# Patient Record
Sex: Female | Born: 1990 | Race: Black or African American | Hispanic: No | Marital: Married | Smoking: Former smoker
Health system: Southern US, Community
[De-identification: ages and names within clinical notes are randomized; demographics above are authoritative.]

## PROBLEM LIST (undated history)

## (undated) DIAGNOSIS — M2550 Pain in unspecified joint: Secondary | ICD-10-CM

## (undated) DIAGNOSIS — Z789 Other specified health status: Secondary | ICD-10-CM

## (undated) HISTORY — PX: NO PAST SURGERIES: SHX2092

---
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
## 2012-12-23 ENCOUNTER — Other Ambulatory Visit: Payer: Self-pay | Admitting: Family Medicine

## 2012-12-23 DIAGNOSIS — N644 Mastodynia: Secondary | ICD-10-CM

## 2012-12-29 ENCOUNTER — Ambulatory Visit
Admission: RE | Admit: 2012-12-29 | Discharge: 2012-12-29 | Disposition: A | Discharge status: Home / Self Care | Payer: BC Managed Care – PPO | Source: Ambulatory Visit | Attending: Family Medicine | Admitting: Family Medicine

## 2012-12-29 DIAGNOSIS — N644 Mastodynia: Secondary | ICD-10-CM

## 2013-01-06 ENCOUNTER — Other Ambulatory Visit: Payer: Self-pay

## 2014-04-29 NOTE — L&D Delivery Note (Signed)
Delivery Note At 8:23 PM a viable female was delivered via  (Presentation:  Occiput posterior ;  ) Nuchal cord x 1 was reduced. .  APGAR:7 and 9 , ; weight  .   Placenta status: intact , . 3 vessel  Cord:  with the following complications:chorioamnionitis  .  Cord pH: NA Anesthesia: Epidural  Episiotomy: None Lacerations:  2nd degree laceration  Suture Repair: 3.0 vicryl Est. Blood Loss (mL):  300 mL  Mom to postpartum.  Baby to Couplet care / Skin to Skin.  Brianna Bennett J. 11/15/2014, 8:58 PM

## 2014-07-29 ENCOUNTER — Other Ambulatory Visit (HOSPITAL_COMMUNITY)
Admission: RE | Admit: 2014-07-29 | Discharge: 2014-07-29 | Disposition: A | Discharge status: Home / Self Care | Payer: Managed Care, Other (non HMO) | Source: Ambulatory Visit | Attending: Obstetrics & Gynecology | Admitting: Obstetrics & Gynecology

## 2014-07-29 ENCOUNTER — Other Ambulatory Visit: Payer: Self-pay | Admitting: Obstetrics & Gynecology

## 2014-07-29 DIAGNOSIS — Z113 Encounter for screening for infections with a predominantly sexual mode of transmission: Secondary | ICD-10-CM | POA: Diagnosis present

## 2014-07-29 DIAGNOSIS — Z01419 Encounter for gynecological examination (general) (routine) without abnormal findings: Secondary | ICD-10-CM | POA: Insufficient documentation

## 2014-08-02 LAB — CYTOLOGY - PAP

## 2014-08-19 LAB — OB RESULTS CONSOLE HIV ANTIBODY (ROUTINE TESTING): HIV: NONREACTIVE

## 2014-08-19 LAB — OB RESULTS CONSOLE RUBELLA ANTIBODY, IGM: RUBELLA: IMMUNE

## 2014-08-19 LAB — OB RESULTS CONSOLE HEPATITIS B SURFACE ANTIGEN: Hepatitis B Surface Ag: NEGATIVE

## 2014-08-19 LAB — OB RESULTS CONSOLE RPR: RPR: NONREACTIVE

## 2014-11-14 ENCOUNTER — Inpatient Hospital Stay (HOSPITAL_COMMUNITY): Payer: No Typology Code available for payment source

## 2014-11-14 ENCOUNTER — Encounter (HOSPITAL_COMMUNITY): Payer: Self-pay | Admitting: *Deleted

## 2014-11-14 ENCOUNTER — Inpatient Hospital Stay (HOSPITAL_COMMUNITY)
Admission: AD | Admit: 2014-11-14 | Discharge: 2014-11-14 | Disposition: A | Discharge status: Home / Self Care | Payer: No Typology Code available for payment source | Source: Ambulatory Visit | Attending: Obstetrics & Gynecology | Admitting: Obstetrics & Gynecology

## 2014-11-14 DIAGNOSIS — IMO0002 Reserved for concepts with insufficient information to code with codable children: Secondary | ICD-10-CM | POA: Insufficient documentation

## 2014-11-14 DIAGNOSIS — Z87891 Personal history of nicotine dependence: Secondary | ICD-10-CM | POA: Diagnosis not present

## 2014-11-14 DIAGNOSIS — Z3A39 39 weeks gestation of pregnancy: Secondary | ICD-10-CM | POA: Insufficient documentation

## 2014-11-14 DIAGNOSIS — O9989 Other specified diseases and conditions complicating pregnancy, childbirth and the puerperium: Secondary | ICD-10-CM | POA: Diagnosis present

## 2014-11-14 HISTORY — DX: Pain in unspecified joint: M25.50

## 2014-11-14 MED ORDER — LACTATED RINGERS IV SOLN
INTRAVENOUS | Status: DC
  Administered 2014-11-14: 21:00:00 via INTRAVENOUS

## 2014-11-14 NOTE — MAU Provider Note (Signed)
Kaylee Armstrong is a [age] y.o. G1P0 at 39.4 weeks came from the office bc of non reactive tracing.   VE in the office 3cm.  BPP in MAU 8/8.    History     There are no active problems to display for this patient.   No chief complaint on file.  HPI  OB History    Gravida Para Term Preterm AB TAB SAB Ectopic Multiple Living   1               Past Medical History  Diagnosis Date  . Joint pain     History reviewed. No pertinent past surgical history.  History reviewed. No pertinent family history.  History  Substance Use Topics  . Smoking status: Former Games developermoker  . Smokeless tobacco: Not on file  . Alcohol Use: Yes     Comment: NONE    DUTING  PREG    Allergies: No Known Allergies  Prescriptions prior to admission  Medication Sig Dispense Refill Last Dose  . acetaminophen (TYLENOL) 325 MG tablet Take 650 mg by mouth every 6 (six) hours as needed.   11/14/2014 at Unknown time  . Prenatal Vit-Fe Fumarate-FA (PRENATAL MULTIVITAMIN) TABS tablet Take 1 tablet by mouth daily at 12 noon.   11/14/2014 at Unknown time    ROS See HPI above, all other systems are negative  Physical Exam   Blood pressure 120/68, pulse 111, temperature 98.2 F (36.8 C), temperature source Oral, resp. rate 20, height 5\' 8"  (1.727 m), weight 262 lb (118.842 kg).  Physical Exam Ext:  WNL ABD: Soft, non tender to palpation, no rebound or guarding SVE -    ED Course  Assessment: IUP at  39.4weeks Membranes: intact FHR:  2006-2040 - Category 2 - 140, minimum variability, no accel, no decels CTX:  irregular BPP 8/8 AFI 13.4  Plan:  IV hydration   Venus Standard, CNM, MSN 11/14/2014. 8:41 PM     2045 Addendum SVE: unchanged from the office check - 3cm 2041 - 2140 - Category 1 - 140 moderate variability, + accel, no decel, occasional ctx  Finish IVF and recheck in 1 hour   2213 MAU Addendum Note  Category 1 VE unchanged     Plan: -Discussed need to follow up in office   -Bleeding and Labor Precautions -kick counts -Encouraged to call if any questions or concerns arise prior to next scheduled office visit.  -Discharged to home in stable condition    Venus Standard, CNM, MSN 11/14/2014. 10:13 PM

## 2014-11-14 NOTE — MAU Note (Signed)
PT  SAYS HE WENT  TO WORK  TODAY   WITH  [facility]-   THEN  TO OFFICE -   - UC Q 3 MIN-  AND  COULD  NOT  GET GOOD  FHR.    VE  3  CM .      DENIES  HSV AND MRSA.  GBS- POSITIVE.

## 2014-11-15 ENCOUNTER — Encounter (HOSPITAL_COMMUNITY): Payer: Self-pay

## 2014-11-15 ENCOUNTER — Inpatient Hospital Stay (HOSPITAL_COMMUNITY): Payer: No Typology Code available for payment source | Admitting: Anesthesiology

## 2014-11-15 ENCOUNTER — Inpatient Hospital Stay (HOSPITAL_COMMUNITY)
Admission: AD | Admit: 2014-11-15 | Discharge: 2014-11-17 | DRG: 775 | Disposition: A | Discharge status: Home / Self Care | Payer: No Typology Code available for payment source | Source: Ambulatory Visit | Attending: Obstetrics & Gynecology | Admitting: Obstetrics & Gynecology

## 2014-11-15 DIAGNOSIS — M358 Other specified systemic involvement of connective tissue: Secondary | ICD-10-CM | POA: Diagnosis present

## 2014-11-15 DIAGNOSIS — Z3A39 39 weeks gestation of pregnancy: Secondary | ICD-10-CM | POA: Diagnosis present

## 2014-11-15 DIAGNOSIS — O411230 Chorioamnionitis, third trimester, not applicable or unspecified: Secondary | ICD-10-CM | POA: Diagnosis present

## 2014-11-15 DIAGNOSIS — O0933 Supervision of pregnancy with insufficient antenatal care, third trimester: Secondary | ICD-10-CM

## 2014-11-15 DIAGNOSIS — N719 Inflammatory disease of uterus, unspecified: Secondary | ICD-10-CM | POA: Diagnosis present

## 2014-11-15 DIAGNOSIS — O99824 Streptococcus B carrier state complicating childbirth: Secondary | ICD-10-CM | POA: Diagnosis present

## 2014-11-15 DIAGNOSIS — Z87891 Personal history of nicotine dependence: Secondary | ICD-10-CM | POA: Diagnosis not present

## 2014-11-15 DIAGNOSIS — IMO0002 Reserved for concepts with insufficient information to code with codable children: Secondary | ICD-10-CM | POA: Insufficient documentation

## 2014-11-15 HISTORY — DX: Other specified health status: Z78.9

## 2014-11-15 LAB — CBC
HCT: 33.6 % — ABNORMAL LOW (ref 36.0–46.0)
Hemoglobin: 11.5 g/dL — ABNORMAL LOW (ref 12.0–15.0)
MCH: 30.7 pg (ref 26.0–34.0)
MCHC: 34.2 g/dL (ref 30.0–36.0)
MCV: 89.8 fL (ref 78.0–100.0)
Platelets: 242 10*3/uL (ref 150–400)
RBC: 3.74 MIL/uL — ABNORMAL LOW (ref 3.87–5.11)
RDW: 13.3 % (ref 11.5–15.5)
WBC: 10.3 10*3/uL (ref 4.0–10.5)

## 2014-11-15 LAB — COMPREHENSIVE METABOLIC PANEL
ALK PHOS: 161 U/L — AB (ref 38–126)
ALT: 25 U/L (ref 14–54)
ANION GAP: 4 — AB (ref 5–15)
AST: 40 U/L (ref 15–41)
Albumin: 3.1 g/dL — ABNORMAL LOW (ref 3.5–5.0)
BILIRUBIN TOTAL: 0.5 mg/dL (ref 0.3–1.2)
BUN: 7 mg/dL (ref 6–20)
CHLORIDE: 106 mmol/L (ref 101–111)
CO2: 23 mmol/L (ref 22–32)
Calcium: 8.6 mg/dL — ABNORMAL LOW (ref 8.9–10.3)
Creatinine, Ser: 0.65 mg/dL (ref 0.44–1.00)
GFR calc Af Amer: >60 mL/min (ref >60)
GLUCOSE: 88 mg/dL (ref 65–99)
POTASSIUM: 4.1 mmol/L (ref 3.5–5.1)
Sodium: 133 mmol/L — ABNORMAL LOW (ref 135–145)
Total Protein: 6.7 g/dL (ref 6.5–8.1)

## 2014-11-15 LAB — OB RESULTS CONSOLE GBS: GBS: POSITIVE

## 2014-11-15 LAB — TYPE AND SCREEN
ABO/RH(D): A POS
Antibody Screen: NEGATIVE

## 2014-11-15 LAB — PROTEIN / CREATININE RATIO, URINE
Creatinine, Urine: 81 mg/dL
Total Protein, Urine: <6 mg/dL

## 2014-11-15 LAB — ABO/RH: ABO/RH(D): A POS

## 2014-11-15 LAB — OB RESULTS CONSOLE GC/CHLAMYDIA
Chlamydia: NEGATIVE
GC PROBE AMP, GENITAL: NEGATIVE

## 2014-11-15 LAB — RPR: RPR: NONREACTIVE

## 2014-11-15 LAB — URIC ACID: URIC ACID, SERUM: 4 mg/dL (ref 2.3–6.6)

## 2014-11-15 MED ORDER — ONDANSETRON HCL 4 MG/2ML IJ SOLN
4.0000 mg | Freq: Four times a day (QID) | INTRAMUSCULAR | Status: DC | PRN
  Administered 2014-11-15: 4 mg via INTRAVENOUS
  Filled 2014-11-15: qty 2

## 2014-11-15 MED ORDER — TERBUTALINE SULFATE 1 MG/ML IJ SOLN
0.2500 mg | Freq: Once | INTRAMUSCULAR | Status: DC | PRN
  Filled 2014-11-15: qty 1

## 2014-11-15 MED ORDER — OXYTOCIN 40 UNITS IN LACTATED RINGERS INFUSION - SIMPLE MED
1.0000 m[IU]/min | INTRAVENOUS | Status: DC
  Administered 2014-11-15: 1 m[IU]/min via INTRAVENOUS

## 2014-11-15 MED ORDER — LIDOCAINE HCL (PF) 1 % IJ SOLN: 10.0000 mL | Freq: Once | INTRAMUSCULAR | Status: DC

## 2014-11-15 MED ORDER — BENZOCAINE-MENTHOL 20-0.5 % EX AERO
1.0000 | INHALATION_SPRAY | CUTANEOUS | Status: DC | PRN
Start: 2014-11-15 — End: 2014-11-17
  Administered 2014-11-16: 1 via TOPICAL
  Filled 2014-11-15 (×2): qty 56

## 2014-11-15 MED ORDER — FENTANYL 2.5 MCG/ML BUPIVACAINE 1/10 % EPIDURAL INFUSION (WH - ANES)
14.0000 mL/h | INTRAMUSCULAR | Status: DC | PRN
  Administered 2014-11-15 (×2): 14 mL/h via EPIDURAL
  Filled 2014-11-15 (×2): qty 125

## 2014-11-15 MED ORDER — LACTATED RINGERS IV SOLN
INTRAVENOUS | Status: DC
  Administered 2014-11-15: 07:00:00 via INTRAVENOUS

## 2014-11-15 MED ORDER — ACETAMINOPHEN 325 MG PO TABS
650.0000 mg | ORAL_TABLET | ORAL | Status: DC | PRN
  Administered 2014-11-15: 650 mg via ORAL
  Administered 2014-11-15: 325 mg via ORAL
  Filled 2014-11-15 (×2): qty 2

## 2014-11-15 MED ORDER — MISOPROSTOL 200 MCG PO TABS: 1000.0000 ug | ORAL_TABLET | Freq: Once | ORAL | Status: DC | PRN

## 2014-11-15 MED ORDER — OXYTOCIN 40 UNITS IN LACTATED RINGERS INFUSION - SIMPLE MED
62.5000 mL/h | INTRAVENOUS | Status: DC
  Filled 2014-11-15: qty 1000

## 2014-11-15 MED ORDER — PHENYLEPHRINE 40 MCG/ML (10ML) SYRINGE FOR IV PUSH (FOR BLOOD PRESSURE SUPPORT)
80.0000 ug | PREFILLED_SYRINGE | INTRAVENOUS | Status: DC | PRN
  Filled 2014-11-15: qty 2
  Filled 2014-11-15: qty 20

## 2014-11-15 MED ORDER — WITCH HAZEL-GLYCERIN EX PADS: 1.0000 "application " | MEDICATED_PAD | CUTANEOUS | Status: DC | PRN

## 2014-11-15 MED ORDER — CARBOPROST TROMETHAMINE 250 MCG/ML IM SOLN
INTRAMUSCULAR | Status: AC
  Filled 2014-11-15: qty 1

## 2014-11-15 MED ORDER — PRENATAL MULTIVITAMIN CH
1.0000 | ORAL_TABLET | Freq: Every day | ORAL | Status: DC
  Administered 2014-11-16 – 2014-11-17 (×2): 1 via ORAL
  Filled 2014-11-15 (×2): qty 1

## 2014-11-15 MED ORDER — DIBUCAINE 1 % RE OINT: 1.0000 "application " | TOPICAL_OINTMENT | RECTAL | Status: DC | PRN

## 2014-11-15 MED ORDER — ONDANSETRON HCL 4 MG/2ML IJ SOLN: 4.0000 mg | INTRAMUSCULAR | Status: DC | PRN

## 2014-11-15 MED ORDER — DIPHENHYDRAMINE HCL 25 MG PO CAPS: 25.0000 mg | ORAL_CAPSULE | Freq: Four times a day (QID) | ORAL | Status: DC | PRN

## 2014-11-15 MED ORDER — VITAMIN K1 1 MG/0.5ML IJ SOLN
INTRAMUSCULAR | Status: AC
  Filled 2014-11-15: qty 0.5

## 2014-11-15 MED ORDER — EPHEDRINE 5 MG/ML INJ
10.0000 mg | INTRAVENOUS | Status: DC | PRN
  Filled 2014-11-15: qty 2

## 2014-11-15 MED ORDER — OXYCODONE-ACETAMINOPHEN 5-325 MG PO TABS: 2.0000 | ORAL_TABLET | ORAL | Status: DC | PRN

## 2014-11-15 MED ORDER — SODIUM CHLORIDE 0.9 % IV SOLN
3.0000 g | Freq: Four times a day (QID) | INTRAVENOUS | Status: AC
  Administered 2014-11-15 – 2014-11-16 (×4): 3 g via INTRAVENOUS
  Filled 2014-11-15 (×5): qty 3

## 2014-11-15 MED ORDER — IBUPROFEN 600 MG PO TABS
600.0000 mg | ORAL_TABLET | Freq: Four times a day (QID) | ORAL | Status: DC
  Administered 2014-11-16 – 2014-11-17 (×8): 600 mg via ORAL
  Filled 2014-11-15 (×8): qty 1

## 2014-11-15 MED ORDER — ACETAMINOPHEN 325 MG PO TABS: 650.0000 mg | ORAL_TABLET | ORAL | Status: DC | PRN

## 2014-11-15 MED ORDER — CARBOPROST TROMETHAMINE 250 MCG/ML IM SOLN: 250.0000 ug | Freq: Once | INTRAMUSCULAR | Status: DC

## 2014-11-15 MED ORDER — SENNOSIDES-DOCUSATE SODIUM 8.6-50 MG PO TABS
2.0000 | ORAL_TABLET | ORAL | Status: DC
  Administered 2014-11-16 (×2): 2 via ORAL
  Filled 2014-11-15 (×2): qty 2

## 2014-11-15 MED ORDER — CITRIC ACID-SODIUM CITRATE 334-500 MG/5ML PO SOLN: 30.0000 mL | ORAL | Status: DC | PRN

## 2014-11-15 MED ORDER — OXYCODONE-ACETAMINOPHEN 5-325 MG PO TABS: 1.0000 | ORAL_TABLET | ORAL | Status: DC | PRN

## 2014-11-15 MED ORDER — BUTORPHANOL TARTRATE 1 MG/ML IJ SOLN
2.0000 mg | Freq: Once | INTRAMUSCULAR | Status: AC
  Administered 2014-11-15: 2 mg via INTRAVENOUS

## 2014-11-15 MED ORDER — FERROUS SULFATE 325 (65 FE) MG PO TABS
325.0000 mg | ORAL_TABLET | Freq: Two times a day (BID) | ORAL | Status: DC
  Administered 2014-11-16 – 2014-11-17 (×4): 325 mg via ORAL
  Filled 2014-11-15 (×4): qty 1

## 2014-11-15 MED ORDER — ZOLPIDEM TARTRATE 5 MG PO TABS: 5.0000 mg | ORAL_TABLET | Freq: Every evening | ORAL | Status: DC | PRN

## 2014-11-15 MED ORDER — BUTORPHANOL TARTRATE 1 MG/ML IJ SOLN
INTRAMUSCULAR | Status: AC
  Filled 2014-11-15: qty 1

## 2014-11-15 MED ORDER — FLEET ENEMA 7-19 GM/118ML RE ENEM: 1.0000 | ENEMA | RECTAL | Status: DC | PRN

## 2014-11-15 MED ORDER — OXYTOCIN 40 UNITS IN LACTATED RINGERS INFUSION - SIMPLE MED: 1.0000 m[IU]/min | INTRAVENOUS | Status: DC

## 2014-11-15 MED ORDER — LACTATED RINGERS IV SOLN: 500.0000 mL | INTRAVENOUS | Status: DC | PRN

## 2014-11-15 MED ORDER — DIPHENHYDRAMINE HCL 50 MG/ML IJ SOLN: 12.5000 mg | INTRAMUSCULAR | Status: DC | PRN

## 2014-11-15 MED ORDER — PENICILLIN G POTASSIUM 5000000 UNITS IJ SOLR
5.0000 10*6.[IU] | Freq: Once | INTRAVENOUS | Status: AC
  Administered 2014-11-15: 5 10*6.[IU] via INTRAVENOUS
  Filled 2014-11-15: qty 5

## 2014-11-15 MED ORDER — LANOLIN HYDROUS EX OINT: TOPICAL_OINTMENT | CUTANEOUS | Status: DC | PRN

## 2014-11-15 MED ORDER — OXYCODONE-ACETAMINOPHEN 5-325 MG PO TABS
1.0000 | ORAL_TABLET | ORAL | Status: DC | PRN
Start: 2014-11-15 — End: 2014-11-15

## 2014-11-15 MED ORDER — MISOPROSTOL 200 MCG PO TABS
ORAL_TABLET | ORAL | Status: AC
  Filled 2014-11-15: qty 5

## 2014-11-15 MED ORDER — BUTORPHANOL TARTRATE 1 MG/ML IJ SOLN
INTRAMUSCULAR | Status: AC
Start: 2014-11-15 — End: 2014-11-15
  Administered 2014-11-15: 2 mg via INTRAVENOUS
  Filled 2014-11-15: qty 1

## 2014-11-15 MED ORDER — LIDOCAINE HCL (PF) 1 % IJ SOLN
30.0000 mL | INTRAMUSCULAR | Status: AC | PRN
  Administered 2014-11-15: 4 mL via SUBCUTANEOUS
  Administered 2014-11-15: 2 mL via SUBCUTANEOUS
  Administered 2014-11-15: 4 mL via SUBCUTANEOUS

## 2014-11-15 MED ORDER — ONDANSETRON HCL 4 MG PO TABS: 4.0000 mg | ORAL_TABLET | ORAL | Status: DC | PRN

## 2014-11-15 MED ORDER — OXYTOCIN BOLUS FROM INFUSION
500.0000 mL | INTRAVENOUS | Status: DC
  Administered 2014-11-15: 500 mL via INTRAVENOUS

## 2014-11-15 MED ORDER — DEXTROSE 5 % IV SOLN
2.5000 10*6.[IU] | INTRAVENOUS | Status: DC
  Administered 2014-11-15 (×2): 2.5 10*6.[IU] via INTRAVENOUS
  Filled 2014-11-15 (×6): qty 2.5

## 2014-11-15 MED ORDER — SIMETHICONE 80 MG PO CHEW: 80.0000 mg | CHEWABLE_TABLET | ORAL | Status: DC | PRN

## 2014-11-15 NOTE — H&P (Signed)
HPI: 24 y/o G1P0 @ 2673w5d estimated gestational age (as dated by 24 week ultrasound) presents complaining of regular painful contractions.   no Leaking of Fluid,   no Vaginal Bleeding,   + Uterine Contractions,  + Fetal Movement.  ROS: no HA, no epigastric pain, no visual changes.    Pregnancy complicated by: 1) Connective tissue disease: +ANA, + antisclerodermAb; SSA/SSB neg, C3/C4 wnl, anti-dsDNA neg- d/w MFM recommend serial growth Last US @ 3544w4d: vertex/anterior/EFW: 7#4oz (65%) 2) Obesity: BMI 39 3) Late prenatal care- starting at 25wks    Prenatal Transfer Tool  Maternal Diabetes: No Genetic Screening: late to care Maternal Ultrasounds/Referrals: Normal Fetal Ultrasounds or other Referrals:  None Maternal Substance Abuse:  No Significant Maternal Medications:  None Significant Maternal Lab Results: Lab values include: Group B Strep positive   PNL:  GBS positive, Rub Immune, Hep B neg, RPR NR, HIV neg, GC/C neg, glucola:normal (80) Immunizations: Tdap given (09/08/14) Hgb: 11.6 Blood type: A positive  OBHx: primip PMHx:  Connective tissue disease (mostly joint pain, managed with OTC medication) Meds:  PNV, Tylenol prn Allergy:  No Known Allergies SurgHx: none SocHx:   Quit in 2013 Tobacco, no  EtOH, no Illicit Drugs  O: BP 139/80 mmHg  Pulse 91  Temp(Src) 98.8 F (37.1 C) (Axillary)  Resp 18  Ht 5\' 8"  (1.727 m)  Wt 118.842 kg (262 lb)  BMI 39.85 kg/m2  SpO2 100% Gen. AAOx3, NAD Abd. Gravid,  no tenderness Extr.  no edema B/L , no calf tenderness bilaterally FHT: 135 baseline, moderate variability, + accels,  no decels Toco: q 3-5 min SVE: 4/80/-2, per CNM Kaylee Armstrong   Labs: see orders  A/P:  24 y.o. G1P0 @ 8173w5d EGA who presents transitioning to active labor -FWB:  NICHD Cat I FHTs -Labor: expectant management -GBS: positive, PCN per protocol -Elevated BP, currently asymptomatic, will check preeclampsia labs  Myna HidalgoJennifer Shalice Woodring, DO [phone]  (pager) [phone] (office)

## 2014-11-15 NOTE — Anesthesia Preprocedure Evaluation (Signed)
Anesthesia Evaluation  Patient identified by MRN, date of birth, ID band Patient awake    Reviewed: Allergy & Precautions, H&P , NPO status , Patient's Chart, lab work & pertinent test results, reviewed documented beta blocker date and time   Airway Mallampati: II  TM Distance: >3 FB Neck ROM: full    Dental no notable dental hx.    Pulmonary former smoker,  breath sounds clear to auscultation  Pulmonary exam normal       Cardiovascular negative cardio ROS Normal cardiovascular examRhythm:regular Rate:Normal     Neuro/Psych negative neurological ROS  negative psych ROS   GI/Hepatic negative GI ROS, Neg liver ROS,   Endo/Other  negative endocrine ROS  Renal/GU negative Renal ROS  negative genitourinary   Musculoskeletal   Abdominal   Peds  Hematology negative hematology ROS (+)   Anesthesia Other Findings   Reproductive/Obstetrics (+) Pregnancy                             Anesthesia Physical Anesthesia Plan  ASA: II  Anesthesia Plan: Epidural   Post-op Pain Management:    Induction:   Airway Management Planned:   Additional Equipment:   Intra-op Plan:   Post-operative Plan:   Informed Consent: I have reviewed the patients History and Physical, chart, labs and discussed the procedure including the risks, benefits and alternatives for the proposed anesthesia with the patient or authorized representative who has indicated his/her understanding and acceptance.     Plan Discussed with:   Anesthesia Plan Comments:         Anesthesia Quick Evaluation

## 2014-11-15 NOTE — Lactation Note (Signed)
This note was copied from the chart of Kaylee Armstrong. Lactation Consultation Note  3 hours old. P1, Mother has large breasts with tough nipples that invert when compressed.   Reviewed hand expression, no drops expressed.  Encouraged mother to continue to try. Prepumped w/ hand pump. Fitted manual pump with #30 flange. Infant oral assessment indicates a high palate and disorganized suck at this time and tongue thrusting. Attempted latching in football hold.  Baby would mouth nipple but did not suck. Introduced #24NS.  Baby sucked a few times but did not sustain latch. Suggest mother place baby STS and retry when baby cues. Room full of family members w/ lots of questions about how we are going to feed baby. Described size of baby's stomach and baby will cue when hungry.   Described cluster feeding. Mom encouraged to feed baby 8-12 times/24 hours and with feeding cues.  Mom made aware of O/P services, breastfeeding support groups, community resources, and our phone # for post-discharge questions.  Suggest parents call if needed.  Discussed plan w/ Tenea RN.  Patient Name: Kaylee Armstrong ZOXWR'UToday's Date: 11/15/2014 Reason for consult: Initial assessment   Maternal Data Has patient been taught Hand Expression?: Yes Does the patient have breastfeeding experience prior to this delivery?: No  Feeding    LATCH Score/Interventions                      Lactation Tools Discussed/Used     Consult Status Consult Status: Follow-up Date: 11/16/14 Follow-up type: In-patient    Dahlia ByesBerkelhammer, Ruth [facility] 11/15/2014, 11:33 PM

## 2014-11-15 NOTE — Progress Notes (Signed)
OB PN:  S: Pt resting comfortably with epidural, no acute complaints  O: BP 111/74 mmHg  Pulse 166  Temp(Src) 98.2 F (36.8 C) (Axillary)  Resp 18  Ht 5\' 8"  (1.727 m)  Wt 118.842 kg (262 lb)  BMI 39.85 kg/m2  SpO2 95%  FHT: 130bpm, moderate variablity, + accels, no decels Toco: q3-795min (though sometimes difficult to trace)  SVE: C/C/+1  A/P: 24 y.o. G1P0 @ 3835w5d in active labor 1. FWB: Cat. I 2. Labor: continue with expectant management.  Plan for trial of pushing once further descent Pain: continue with epidural GBS: positive, continue PCN per protocol  Myna HidalgoJennifer Kenyette Gundy, DO [phone] (pager) [phone] (office)

## 2014-11-15 NOTE — Anesthesia Procedure Notes (Addendum)
Epidural Patient location during procedure: OB  Staffing Anesthesiologist: Dace Denn Performed by: anesthesiologist   Preanesthetic Checklist Completed: patient identified, site marked, surgical consent, pre-op evaluation, timeout performed, IV checked, risks and benefits discussed and monitors and equipment checked  Epidural Patient position: sitting Prep: site prepped and draped and DuraPrep Patient monitoring: continuous pulse ox and blood pressure Approach: midline Location: L2-L3 Injection technique: LOR air  Needle:  Needle type: Tuohy  Needle gauge: 17 G Needle length: 9 cm and 9 Needle insertion depth: 6 cm Catheter type: closed end flexible Catheter size: 19 Gauge Catheter at skin depth: 10 cm Test dose: negative  Assessment Events: blood not aspirated, injection not painful, no injection resistance, negative IV test and no paresthesia  Additional Notes Patient identified. Risks/Benefits/Options discussed with patient including but not limited to bleeding, infection, nerve damage, paralysis, failed block, incomplete pain control, headache, blood pressure changes, nausea, vomiting, reactions to medication both or allergic, itching and postpartum back pain. Confirmed with bedside nurse the patient's most recent platelet count. Confirmed with patient that they are not currently taking any anticoagulation, have any bleeding history or any family history of bleeding disorders. Patient expressed understanding and wished to proceed. All questions were answered. Sterile technique was used throughout the entire procedure. Please see nursing notes for vital signs. Test dose was given through epidural catheter and negative prior to continuing to dose epidural or start infusion. Warning signs of high block given to the patient including shortness of breath, tingling/numbness in hands, complete motor block, or any concerning symptoms with instructions to call for help. Patient was  given instructions on fall risk and not to get out of bed. All questions and concerns addressed with instructions to call with any issues or inadequate analgesia.

## 2014-11-15 NOTE — MAU Note (Signed)
Fetal monitor tracing from MAU is a paper tracing, not recorded in [facility].

## 2014-11-15 NOTE — Progress Notes (Signed)
OB PN:  S: Pt resting comfortably with epidurall, no acute complaints  O: BP 141/91 mmHg  Pulse 96  Temp(Src) 97.8 F (36.6 C) (Axillary)  Resp 18  Ht 5\' 8"  (1.727 m)  Wt 118.842 kg (262 lb)  BMI 39.85 kg/m2  SpO2 95%  FHT: 130bpm, moderate variablity, + accels, no decels Toco: q623min SVE: 9/C/-2  A/P: 24 y.o. G1P0 @ 281w5d in active labor 1. FWB: Cat. i 2. Labor: continue with expectant management Pain: continue with epidural GBS: positive, continue PCN per protocol  Myna HidalgoJennifer Mendell Bontempo, DO [phone] (pager) [phone] (office)

## 2014-11-15 NOTE — H&P (Signed)
Kaylee DanielDeja Raul Armstrong is a 24 y.o. female, G1 P0 at 39.5 weeks returned c/o more intense ctx.  Denies vb orlof w/+FM  Patient Active Problem List   Diagnosis Date Noted  . Indication for care in labor or delivery 11/15/2014    Pregnancy Course: EDC of 11/16/14 was established.      US evaluations:  No information in [place]       Significant prenatal events:     Last evaluation:     Reason for admission:  labor  Pt States:   Contractions Frequency: q3-4         Contraction severity: strong         Fetal activity: +FM  OB History    Gravida Para Term Preterm AB TAB SAB Ectopic Multiple Living   1              Past Medical History  Diagnosis Date  . Joint pain   . Medical history non-contributory    Past Surgical History  Procedure Laterality Date  . No past surgeries     Family History: family history is not on file. Social History:  reports that she has quit smoking. She does not have any smokeless tobacco history on file. She reports that she drinks alcohol. She reports that she does not use illicit drugs.   Prenatal Transfer Tool  Maternal Diabetes: No per pt Genetic Screening:  Maternal Ultrasounds/Referrals: no Fetal Ultrasounds or other Referrals:  no Maternal Substance Abuse:  no Significant Maternal Medications:  no Significant Maternal Lab Results:  No per pt   ROS:  See HPI above, all other systems are negative  No Known Allergies  Dilation: 4.5 Effacement (%): 80 Station: -2 Exam by:: Blaise Palladino, CNM  Blood pressure 141/79, pulse 98, temperature 99.1 F (37.3 C), temperature source Oral, resp. rate 21, height 5\' 8"  (1.727 m), weight 262 lb (118.842 kg), SpO2 100 %.  Maternal Exam:  Uterine Assessment: Contraction frequency is rare.  Abdomen: Gravid, non tender. Fundal height is aga.  Normal external genitalia, vulva, cervix, uterus and adnexa.  No lesions noted on exam.  Pelvis adequate for delivery.  Fetal presentation: Vertex by VE  Fetal Exam:   Monitor Surveillance : Continuous Monitoring  Mode: Ultrasound.  NICHD: Category 1, 140, moderate variability, + accel, no decel CTXs: Q 3-714minutes EFW   7 lbs  Physical Exam: Nursing note and vitals reviewed General: alert and cooperative She appears well nourished Psychiatric: Normal mood and affect. Her behavior is normal Head: Normocephalic Eyes: Pupils are equal, round, and reactive to light Neck: Normal range of motion Cardiovascular: RRR without murmur  Respiratory: CTAB. Effort normal  Abd: soft, non-tender, +BS, no rebound, no guarding  Genitourinary: Vagina normal  Neurological: A&Ox3 Skin: Warm and dry  Musculoskeletal: Normal range of motion  Homan's sign negative bilaterally No evidence of DVTs.  Edema: Minimal bilaterally non-pitting edema DTR: 2+ Clonus: None   Prenatal labs: No information in [facility]   Assessment:  IUP at 39.5 weeks NICHD: Category 1 Membranes: BBW GBS positive  Plan:  Admit to L&D for expectant management of labor. Possible augmentation options reviewed including AROM and/or pitocin.  GBS prophylaxis with PCN G per Arvle Grabe dosing  IV pain medication per orders PRN Epidural per patient request Foley cath after patient is comfortable with epidural Anticipate SVD Labor mgmt as ordered Donation of Cord Blood Collection:      Attending MD available at all times.  Doryce Mcgregory, CNM, MSN 11/15/2014, 5:39 AM

## 2014-11-15 NOTE — Progress Notes (Signed)
Orders for admission and epidural prn

## 2014-11-15 NOTE — MAU Note (Signed)
Pt returns for worsening contractions.  

## 2014-11-15 NOTE — Progress Notes (Signed)
OB PN:  S: Pt starting to feel more pressure to push.  O: BP 127/48 mmHg  Pulse 107  Temp(Src) 99.8 F (37.7 C) (Axillary)  Resp 18  Ht 5\' 8"  (1.727 m)  Wt 118.842 kg (262 lb)  BMI 39.85 kg/m2  SpO2 95%  FHT: 130bpm, moderate variablity, + accels, no decels Toco: q3-315min (though sometimes difficult to trace)  SVE: C/C/+2  A/P: 24 y.o. G1P0 @ 7388w5d in active labor 1. FWB: Cat. I 2. Labor: Pt labored down, now starting to push- pushing thus far for less than one hour. 3. Suspected intrauterine infection- temp @ 101 @ 1746, pt transitioned to Unasyn IV.  Tylenol prn for temp 4. Pain: continue with epidural 5. GBS: positive, s/p PCN, now on Unasyn due to suspected infection 6. Elevated BP noted in labor, preeclampsia labs negative  Dr. Richardson Doppole to take over care from 5p-7pm then [facility] for this evening.  I will resume care in the am.  Myna HidalgoJennifer Latonia Conrow, DO [phone] (office)

## 2014-11-16 ENCOUNTER — Encounter (HOSPITAL_COMMUNITY): Payer: Self-pay

## 2014-11-16 LAB — CBC
HEMATOCRIT: 31.3 % — AB (ref 36.0–46.0)
Hemoglobin: 10.5 g/dL — ABNORMAL LOW (ref 12.0–15.0)
MCH: 30.5 pg (ref 26.0–34.0)
MCHC: 33.5 g/dL (ref 30.0–36.0)
MCV: 91 fL (ref 78.0–100.0)
Platelets: 219 10*3/uL (ref 150–400)
RBC: 3.44 MIL/uL — ABNORMAL LOW (ref 3.87–5.11)
RDW: 13.6 % (ref 11.5–15.5)
WBC: 17.1 10*3/uL — ABNORMAL HIGH (ref 4.0–10.5)

## 2014-11-16 NOTE — Progress Notes (Signed)
Postpartum Note Day # 1  S:  Patient resting comfortable in bed.  Pain controlled.  Tolerating general. No flatus, no BM.  Lochia moderate.  Ambulating without difficulty.  She denies n/v/f/c, SOB, or CP.  Pt plans on breastfeeding.  O: Temp:  [97.8 F (36.6 C)-101.2 F (38.4 C)] 98 F (36.7 C) (07/20 0600) Pulse Rate:  [88-166] 92 (07/20 0600) Resp:  [18] 18 (07/20 0600) BP: (111-156)/(48-102) 111/66 mmHg (07/20 0600) SpO2:  [95 %-100 %] 100 % (07/20 0010)  Late temp @1935  (7/19): 101.2  Gen: A&Ox3, NAD Abdomen: soft, NT, ND Uterus: firm, non-tender, below umbilicus Ext: No edema, no calf tenderness bilaterally  Labs:  Recent Labs  11/15/14 0430 11/16/14 0540  HGB 11.5* 10.5*    A/P: Pt is a 24 y.o. G1P1001 s/p NSVD, PPD#1  - Pain well controlled -GU: Voiding freely -GI: Tolerating general diet -Activity: encouraged sitting up to chair and ambulation as tolerated -Prophylaxis: early ambulation -Suspected intrauterine infections, on IV Unasyn x 24hr, currently afebrile -Labs: stable as above -Plan for baby boy circumcision later today   Myna HidalgoJennifer Karyme Mcconathy, DO [phone] (pager) [phone] (office)

## 2014-11-16 NOTE — Lactation Note (Signed)
This note was copied from the chart of Kaylee Armstrong. Lactation Consultation Note; Mom with large breasts and nipples are flat and tough- Difficult latch for baby.- used NS took a few sucks then off the breast. Has given formula twice today. DEBP setup for mom mom pumping as I left room. Encouraged frequent pumping to promote supply and hopefully erect nipples. No questions at present. To call for assist prn  Patient Name: Kaylee Armstrong ZOXWR'UToday's Date: 11/16/2014 Reason for consult: Follow-up assessment   Maternal Data Formula Feeding for Exclusion: No Has patient been taught Hand Expression?: Yes Does the patient have breastfeeding experience prior to this delivery?: No  Feeding Feeding Type: Breast Fed Length of feed: 16 min  LATCH Score/Interventions Latch: Too sleepy or reluctant, no latch achieved, no sucking elicited.  Audible Swallowing: None  Type of Nipple: Flat  Comfort (Breast/Nipple): Soft / non-tender     Hold (Positioning): Assistance needed to correctly position infant at breast and maintain latch.  LATCH Score: 4  Lactation Tools Discussed/Used Tools: Nipple Dorris CarnesShields;Pump Nipple shield size: 24 Breast pump type: Double-Electric Breast Pump WIC Program: No Pump Review: Setup, frequency, and cleaning Initiated by:: DW Date initiated:: 11/16/14   Consult Status Consult Status: Follow-up Date: 11/17/14 Follow-up type: In-patient    Pamelia HoitWeeks, Edynn Gillock D 11/16/2014, 2:04 PM

## 2014-11-16 NOTE — Clinical Social Work Maternal (Signed)
CLINICAL SOCIAL WORK MATERNAL/CHILD NOTE  Patient Details  Name: Kaylee Armstrong MRN: [id number] Date of Birth: [birth year]  Date:  11/16/2014  Clinical Social Worker Initiating Note:  Loleta Books, LCSW Date/ Time Initiated:  11/16/14/1315     Child's Name:  Kaylee Armstrong   Legal Guardian:  Chrisandra Netters and Sheral Flow  Need for Interpreter:  None   Date of Referral:  11/15/14     Reason for Referral:  Current Substance Use/Substance Use During Pregnancy    Referral Source:  [facility]   Address:  [address] [place], [place] [address]  Phone number:  [phone]   Household Members:  Significant Other   Natural Supports (not living in the home):  Extended Family, Immediate Family   Professional Supports: None   Employment: Full-time   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 FOB stated that he has 3 different jobs   Education:    N/A  Architect:  Media planner   Other Resources:    N/A  Cultural/Religious Considerations Which May Impact Care:  None reported  Strengths:  Ability to meet basic needs , Home prepared for child    Risk Factors/Current Problems:   1)Substance Use: MOB reported THC and etoh use until she learned that she was pregnant in April. She denied any substance use since that time. Infant's UDS is negative and MDS is pending.   Cognitive State:  Able to Concentrate , Alert , Linear Thinking , Insightful    Mood/Affect:  Bright , Comfortable , Calm , Happy , Interested    CSW Assessment:  CSW received request for consult due to MOB presenting with a history of THC use during pregnancy.  MOB and FOB presented as easily engaged and receptive to the visit. MOB was noted to be in a pleasant mood and displayed a full range in affect. MOB was breastfeeding and attending to the infant during the visit.  FOB actively participated in the assessment and presented as supportive of MOB and infant.   MOB endorsed  feelings of excitement and happiness as she becomes a first time mother. She discussed feelings of relief now that the infant has been born. MOB reflected upon the numerous visits "back and forth" to home and MAU when she was experiencing contractions but was not yet ready to be admitted.  MOB shared that she has been tired and exhausted, but discussed satisfaction with her birth story and how breastfeeding is going thus far.  MOB discussed the stress she felt the previous evening since she infant was crying and she was concerned that the infant was not receiving enough nutrition from her colostrum. She shared that she decided to supplement with formula since she wanted to soothe him, and denies feelings of regret associated with choosing to supplement. MOB shared that she is now working on breastfeeding, and is eager to continue to put forth effort to breastfeed.  MOB endorsed feeling well supported by both sides of their families, and expressed belief that she will be able to get some sleep and feel supported once she leaves the hospital.  She stated that she and the FOB live independently, but that they will be staying with her mother for the next week in order to receive more support.  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 She shared belief that she has  learned how to have patience, and discussed an awareness how this will benefit her postpartum.  MOB denied mental health concerns during the pregnancy, and denied a mental health history pre-pregnancy.  MOB presented as attentive and engaged as CSW provided education on perinatal mood and anxiety disorders, and agreed to contact her medical provider if she notes onset of symptoms.  Per MOB, she and the FOB learned of the pregnancy in April.  MOB endorsed social use of THC and etoh, and shared that she stopped all substance use once she  learned that she was pregnant.  Per MOB, she was nervous and highly concerned about health status of the infant due substance use, but reported that she felt better once they received their first ultrasound and was informed that infant was doing well. She discussed that she continues to feel better now that she can see the infant.  MOB reported that THC use was infrequent, and shared that she it was approximately 1-2 times per month.  MOB reported last THC use was in March.  MOB verbalized understanding of the hospital drug screen policy, and denied questions or concerns related to the collection of the infant's urine and meconium.  MOB and FOB acknowledged that a CPS report will be made if there is a positive drug screen, but denied concerns after CSW provided education on what to anticipate.  MOB denied questions, concerns, or needs. She expressed appreciation for the visit, and agreed to contact CSW if needs arise during admission.   CSW Plan/Description:  1)Patient/Family Education: Perinatal mood and anxiety disorders, hospital drug screen policy 2) CSW to monitor infant's MDS and will notify CPS if there is a positive drug screen. 3)No Further Intervention Required/No Barriers to Discharge    Kelby FamVenning, Brennah Quraishi N, LCSW 11/16/2014, 4:10 PM

## 2014-11-16 NOTE — Anesthesia Postprocedure Evaluation (Signed)
  Anesthesia Post-op Note  Patient: Kaylee NettersDeja Armstrong  Procedure(s) Performed: * No procedures listed *  Patient Location: PACU and Mother/Baby  Anesthesia Type:Epidural  Level of Consciousness: awake, alert , oriented and patient cooperative  Airway and Oxygen Therapy: Patient Spontanous Breathing  Post-op Pain: mild  Post-op Assessment: Post-op Vital signs reviewed, Patient's Cardiovascular Status Stable, Respiratory Function Stable, Patent Airway, No signs of Nausea or vomiting, Adequate PO intake, Pain level controlled, No headache, No backache and Patient able to bend at knees              Post-op Vital Signs: Reviewed and stable  Last Vitals:  Filed Vitals:   11/16/14 1115  BP: 112/60  Pulse: 83  Temp: 36.7 C  Resp: 18    Complications: No apparent anesthesia complications

## 2014-11-17 MED ORDER — IBUPROFEN 600 MG PO TABS: 600.0000 mg | ORAL_TABLET | Freq: Four times a day (QID) | ORAL | Status: DC

## 2014-11-17 NOTE — Discharge Instructions (Signed)

## 2014-11-17 NOTE — Lactation Note (Signed)
This note was copied from the chart of Kaylee Armstrong. Lactation Consultation Note 2 week [employer] rental completed. Encouraged to page for assist at next feeding.  Patient Name: Kaylee Jetaun Colbath NFAOZ'H Date: 11/17/2014     Maternal Data    Feeding    LATCH Score/Interventions                      Lactation Tools Discussed/Used     Consult Status      Pamelia Hoit 11/17/2014, 11:23 AM

## 2014-11-17 NOTE — Lactation Note (Signed)
This note was copied from the chart of Boy Inaara Tye. Lactation Consultation Note  Patient Name: Boy Joelle Roswell RUEAV'W Date: 11/17/2014 Reason for consult: Follow-up assessment (pumped with DEBP for 20 mins with EBM yield )  2nd visit for today for LC . Mom requested trying to latch the baby with a nipple shield.  LC assessed breast tissue and noted mom to have large breast bilaterally with dependent edema lateral aspects And inner aspects of both breast. Nipples bilaterally inverted and non - compressible , tough and not a candidate  For a nipple shield. The nipple shield has no tissue to be applied to. LC highly recommended feeding the baby from  A bottle ( medium broad based nipple) ( LC showed mom and grandmother how to feed the baby from a bottle by tickling upper lip  With the nipple , waiting for the baby to open wide, and making sure the lips are flanged like fish lips.  LC recommended to mom due to challenging tissue for latching at this point to be consistent with pumping both breast with DEBP  Every 2-3 hours for 15 -20 mins and it may be 10 x's in 24 hours  when the milk comes in. [facility] assessed mom pumping both breast  With #24 flange , mom ok on the lower speed , when the pump increased , per mom some discomfort. LC suggested to mom to increase to the #27 flange  Both breast and #30 x2 provided to mom for when her milk comes in . With pumping 20 mins - 15 ml EBM noted , more from the left breast compared to the right. Nipple came out slightly , areola still non - compressible. Sore nipple and engorgement prevention and tx reviewed. Mom receptive to [facility] recommendations  To pump for now to establish and protect milk supply , and feed the baby with a bottle ( mom , and grandmother aware the baby needs to be increased gradually  With volume he is being fed. For LC he took 20 ml/ tolerated well , no spitting. Baby also stooled , LC changed diaper and notified [facility] RN.  Mom as already rented  a DEBP for 2 weeks until she can obtain  her DEBP from insurance company.  Mother informed of post-discharge support and given phone number to the lactation department, including services for phone call assistance; out-patient appointments; and breastfeeding support group. List of other breastfeeding resources in the community given in the handout. Encouraged mother to call for problems or concerns related to breastfeeding.   Maternal Data    Feeding Feeding Type: Bottle Fed - Formula Nipple Type: Slow - flow  LATCH Score/Interventions                Intervention(s): Breastfeeding basics reviewed     Lactation Tools Discussed/Used Tools: Pump;Flanges Flange Size: 24 (plan to increase to #27 , when milk comes in #30 ) Pump Review: Setup, frequency, and cleaning;Milk Storage Initiated by:: MAI  Date initiated:: 11/17/14   Consult Status Consult Status: Follow-up Date: 11/18/14 Follow-up type: In-patient    Kathrin Greathouse 11/17/2014, 4:59 PM

## 2014-11-17 NOTE — Progress Notes (Signed)
Postpartum Note Day # 2  S:  Patient resting comfortable in bed.  Pain controlled.  Tolerating general. + flatus, + BM.  Lochia moderate.  Ambulating without difficulty.  She denies n/v/f/c, SOB, or CP.  Pt plans on breastfeeding.  O: Temp:  [97.5 F (36.4 C)-98.2 F (36.8 C)] 98.2 F (36.8 C) (07/21 0617) Pulse Rate:  [83-100] 84 (07/21 0617) Resp:  [18] 18 (07/21 0617) BP: (103-112)/(60-71) 104/71 mmHg (07/21 0617) SpO2:  [100 %] 100 % (07/20 1814)  Afebrile x 24hr  Gen: A&Ox3, NAD CV: RRR Lungs: CTAB Abdomen: obese, soft, NT, ND Uterus: firm, non-tender, below umbilicus Ext: No edema, no calf tenderness bilaterally  Labs:   Recent Labs  11/15/14 0430 11/16/14 0540  HGB 11.5* 10.5*    A/P: Pt is a 24 y.o. G1P1001 s/p NSVD, PPD#2  - Pain well controlled -GU: Voiding freely -GI: Tolerating general diet -Activity: encouraged sitting up to chair and ambulation as tolerated -Prophylaxis: early ambulation -Suspected intrauterine infections, s/p IV Unasyn x 24hr, currently afebrile and asymptomatic -Labs: stable as above -Baby boy circ today  Meeting postpartum milestones appropriately, plan for discharge home today  Myna Hidalgo, DO [phone] (pager) [phone] (office)

## 2014-11-17 NOTE — Lactation Note (Signed)
This note was copied from the chart of Kaylee Magda Muise. Lactation Consultation Note; Mom has not paged for assist. She gave formula at 12:30. Has not pumped again. Reports breasts are feeling fuller now. Eating lunch. Encouraged to pump after eating. TO call for assist if ready to latch baby. Reviewed OP appointments as a resource for support after DC. Does not want to make appointment at this time.   Patient Name: Kaylee Armstrong NGEXB'M Date: 11/17/2014     Maternal Data    Feeding    LATCH Score/Interventions                      Lactation Tools Discussed/Used     Consult Status      Pamelia Hoit 11/17/2014, 2:15 PM

## 2014-11-17 NOTE — Lactation Note (Signed)
This note was copied from the chart of Kaylee Keiaira Donlan. Lactation Consultation Note; Dad bottle feeding few drops of Colostrum and formula as I went into room. Mom reports she has just pumped and only obtaining few drops at a time.. Reports baby has latched with NS. Wants 2 week [employer] rental- paper work left with mom- will complete before DC.   Patient Name: Kaylee Armstrong'U Date: 11/17/2014     Maternal Data    Feeding    LATCH Score/Interventions                      Lactation Tools Discussed/Used     Consult Status      Pamelia Hoit 11/17/2014, 10:00 AM

## 2014-11-22 NOTE — Discharge Summary (Signed)
OB Discharge Summary  Patient Name: Kaylee Armstrong DOB: March 29, 1991 MRN: [id number]  Date of admission: 11/15/2014     Date of discharge: 11/17/2014  6:02 PM  Admitting diagnosis: Onset of Labor   Intrauterine pregnancy: [redacted]w[redacted]d     Secondary diagnosis: Obesity Pregnancy complicated by: 1) Connective tissue disease: +ANA, + antisclerodermAb; SSA/SSB neg, C3/C4 wnl, anti-dsDNA neg- d/w MFM recommend serial growth Last [place] @ [redacted]w[redacted]d: vertex/anterior/EFW: 7#4oz (65%) 2) Obesity: BMI 39 3) Late prenatal care- starting at 25wks 4) GBS positive, pt received PCN per protocol     Discharge diagnosis: Term Pregnancy Delivered and Suspected Intrauterine infection  Method of delivery:      Information for the patient's newborn:  Janny, Crute [[id number]]  Delivery Method: Vag-Spont                                                      Post partum procedures:none      Hospital course:  Onset of Labor With Vaginal Delivery     24 y.o. yo G1P1001 at [redacted]w[redacted]d was admitted in Latent Labor on 11/15/2014. Patient had an uncomplicated labor course as follows:                                  Mediations and procedures used include: Pitocin and AROM  Patient had a delivery of a Viable infant. 11/15/2014  Information for the patient's newborn:  Jariyah, Hackley [[id number]]  Delivery Method: Vag-Spont    Pateint had an uncomplicated postpartum course.  She is ambulating, tolerating a regular diet, passing flatus, and urinating well. Patient is discharged home in stable condition on 11/17/2014  6:02 PM.    Physical exam  Filed Vitals:   11/16/14 0600 11/16/14 1115 11/16/14 1814 11/17/14 0617  BP: 111/66 112/60 103/63 104/71  Pulse: 92 83 100 84  Temp: 98 F (36.7 C) 98 F (36.7 C) 97.5 F (36.4 C) 98.2 F (36.8 C)  TempSrc: Oral Oral Axillary Oral  Resp: Height:      Weight:      SpO2:   100%    General: alert, cooperative and no  distress Lochia: appropriate Uterine Fundus: firm, non-tender, below umbilicus Incision: N/A DVT Evaluation: No evidence of DVT seen on physical exam. Labs: Lab Results  Component Value Date   WBC 17.1* 11/16/2014   HGB 10.5* 11/16/2014   HCT 31.3* 11/16/2014   MCV 91.0 11/16/2014   PLT 219 11/16/2014   CMP Latest Ref Rng 11/15/2014  Glucose 65 - 99 mg/dL 88  BUN 6 - 20 mg/dL 7  Creatinine 0.86 - 5.78 mg/dL 4.69  Sodium 629 - 528 mmol/L 133(L)  Potassium 3.5 - 5.1 mmol/L 4.1  Chloride 101 - 111 mmol/L 106  CO2 22 - 32 mmol/L 23  Calcium 8.9 - 10.3 mg/dL 4.1(L)  Total Protein 6.5 - 8.1 g/dL 6.7  Total Bilirubin 0.3 - 1.2 mg/dL 0.5  Alkaline Phos 38 - 126 U/L 161(H)  AST 15 - 41 U/L 40  ALT 14 - 54 U/L 25    Discharge instruction: per After Visit Summary and "Baby and Me Booklet".  Medications:   Medication List    TAKE these medications  acetaminophen 325 MG tablet  Commonly known as:  TYLENOL  Take 325 mg by mouth every 6 (six) hours as needed for mild pain.     ibuprofen 600 MG tablet  Commonly known as:  ADVIL,MOTRIN  Take 1 tablet (600 mg total) by mouth every 6 (six) hours.     loratadine 10 MG tablet  Commonly known as:  CLARITIN  Take 10 mg by mouth daily.     prenatal multivitamin Tabs tablet  Take 1 tablet by mouth daily at 12 noon.     triamcinolone cream 0.1 %  Commonly known as:  KENALOG  Apply 1 application topically 2 (two) times daily as needed (eczema).        Diet: routine diet  Activity: Advance as tolerated. Pelvic rest for 6 weeks.   Outpatient follow up:6 weeks  Postpartum contraception:  Undecided  Newborn Data: Live born female  Birth Weight: 6 lb 10.7 oz (3025 g) APGAR: 8, 9  Baby Feeding: Breast Disposition:home with mother   11/22/2014 Myna Hidalgo, M, DO

## 2014-11-23 ENCOUNTER — Ambulatory Visit (HOSPITAL_COMMUNITY)
Admission: RE | Admit: 2014-11-23 | Discharge: 2014-11-23 | Disposition: A | Discharge status: Home / Self Care | Payer: No Typology Code available for payment source | Source: Ambulatory Visit | Attending: Obstetrics and Gynecology | Admitting: Obstetrics and Gynecology

## 2014-11-23 NOTE — Lactation Note (Signed)
Lactation Consult  Mother's reason for visit:  Help in getting baby to the breast Visit Type:  OP Appointment Notes:  Kaylee Armstrong is expressing 4 times in 24 hours.  This expressed milk plus formula is fed to Kaylee Armstrong.  Attempted to latch him to the breast today both with and without a NS but it was unsuccessful.  He also was not able to transfer from a syringe SNS as he could not maintain a vacuum. His suck feels strong but the tightness in the posterior portion of his mouth can be affecting transfer.  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  His upper lip is restricted as well as it does not flange without assistance.  Mom has large breasts and areolar edema which flattens the nipple.  This further complicates latching.  Instructed to continue latch attempts. Use paced bottle feeding and increase pumping sessions to 8-10 times in 24 hours.  Mom will call if further assistance is desired. She commented that the consultation was helpful to her.  Consult:  Initial Lactation Consultant:  Soyla Dryer  __  Baby's Name: Kaylee Armstrong. Date of Birth: 11/15/2015 Pediatrician: [facility] Gender: female Gestational Age: [redacted]w[redacted]d (At Birth) Birth Weight: 6 lb 10.7 oz (3025 g) Weight at Discharge: Weight: 6 lb 8.1 oz (2950 g)Date of Discharge: 11/17/2014 [facility] Weights   11/15/14 2023 11/17/14 0128  Weight: 6 lb 10.7 oz (3025 g) 6 lb 8.1 oz (2950 g)   [facility]:7# 6oz  Location:[facility] Weight today: 3248   ______________________________________________________________________    ________________________________________________________________________  Mother's Name: Kaylee Armstrong Maternal Medications:  PNV, IBUPROFEN  ________________________________________________________________________   Voids:  6+ in 24 hrs.  Color:  Clear yellow Stools:  6+ in 24 hrs.  Color:   Yellow  ________________________________________________________________________

## 2014-11-24 ENCOUNTER — Inpatient Hospital Stay (HOSPITAL_COMMUNITY): Admission: RE | Admit: 2014-11-24 | Payer: Managed Care, Other (non HMO) | Source: Ambulatory Visit

## 2016-06-04 ENCOUNTER — Ambulatory Visit
Admission: RE | Admit: 2016-06-04 | Discharge: 2016-06-04 | Disposition: A | Discharge status: Home / Self Care | Payer: BC Managed Care – PPO | Source: Ambulatory Visit | Attending: Family Medicine | Admitting: Family Medicine

## 2016-06-04 ENCOUNTER — Other Ambulatory Visit: Payer: Self-pay | Admitting: Family Medicine

## 2016-06-04 DIAGNOSIS — R0602 Shortness of breath: Secondary | ICD-10-CM

## 2016-06-04 MED ORDER — IOPAMIDOL (ISOVUE-370) INJECTION 76%
75.0000 mL | Freq: Once | INTRAVENOUS | Status: AC | PRN
  Administered 2016-06-04: 75 mL via INTRAVENOUS

## 2019-11-09 ENCOUNTER — Encounter (HOSPITAL_BASED_OUTPATIENT_CLINIC_OR_DEPARTMENT_OTHER): Payer: Self-pay

## 2019-11-09 ENCOUNTER — Other Ambulatory Visit: Payer: Self-pay

## 2019-11-09 ENCOUNTER — Emergency Department (HOSPITAL_BASED_OUTPATIENT_CLINIC_OR_DEPARTMENT_OTHER)
Admission: EM | Admit: 2019-11-09 | Discharge: 2019-11-09 | Disposition: A | Discharge status: Home / Self Care | Payer: BC Managed Care – PPO | Attending: Emergency Medicine | Admitting: Emergency Medicine

## 2019-11-09 DIAGNOSIS — R42 Dizziness and giddiness: Secondary | ICD-10-CM | POA: Diagnosis present

## 2019-11-09 DIAGNOSIS — Z87891 Personal history of nicotine dependence: Secondary | ICD-10-CM | POA: Diagnosis not present

## 2019-11-09 LAB — COMPREHENSIVE METABOLIC PANEL
ALT: 19 U/L (ref 0–44)
AST: 33 U/L (ref 15–41)
Albumin: 4 g/dL (ref 3.5–5.0)
Alkaline Phosphatase: 45 U/L (ref 38–126)
Anion gap: 8 (ref 5–15)
BUN: 13 mg/dL (ref 6–20)
CO2: 24 mmol/L (ref 22–32)
Calcium: 8.9 mg/dL (ref 8.9–10.3)
Chloride: 101 mmol/L (ref 98–111)
Creatinine, Ser: 0.81 mg/dL (ref 0.44–1.00)
GFR calc Af Amer: >60 mL/min (ref >60)
GFR calc non Af Amer: >60 mL/min (ref >60)
Glucose, Bld: 136 mg/dL — ABNORMAL HIGH (ref 70–99)
Potassium: 4.4 mmol/L (ref 3.5–5.1)
Sodium: 133 mmol/L — ABNORMAL LOW (ref 135–145)
Total Bilirubin: 0.4 mg/dL (ref 0.3–1.2)
Total Protein: 10.3 g/dL — ABNORMAL HIGH (ref 6.5–8.1)

## 2019-11-09 LAB — URINALYSIS, ROUTINE W REFLEX MICROSCOPIC
Bilirubin Urine: NEGATIVE
Glucose, UA: NEGATIVE mg/dL
Ketones, ur: NEGATIVE mg/dL
Nitrite: NEGATIVE
Protein, ur: 30 mg/dL — AB
Specific Gravity, Urine: 1.025 (ref 1.005–1.030)
pH: 7.5 (ref 5.0–8.0)

## 2019-11-09 LAB — CBC
HCT: 35.7 % — ABNORMAL LOW (ref 36.0–46.0)
Hemoglobin: 11.7 g/dL — ABNORMAL LOW (ref 12.0–15.0)
MCH: 29.8 pg (ref 26.0–34.0)
MCHC: 32.8 g/dL (ref 30.0–36.0)
MCV: 91.1 fL (ref 80.0–100.0)
Platelets: 277 10*3/uL (ref 150–400)
RBC: 3.92 MIL/uL (ref 3.87–5.11)
RDW: 12.1 % (ref 11.5–15.5)
WBC: 9.6 10*3/uL (ref 4.0–10.5)
nRBC: 0 % (ref 0.0–0.2)

## 2019-11-09 LAB — URINALYSIS, MICROSCOPIC (REFLEX)

## 2019-11-09 LAB — PREGNANCY, URINE: Preg Test, Ur: NEGATIVE

## 2019-11-09 LAB — LIPASE, BLOOD: Lipase: 32 U/L (ref 11–51)

## 2019-11-09 MED ORDER — MECLIZINE HCL 25 MG PO TABS
25.0000 mg | ORAL_TABLET | Freq: Once | ORAL | Status: AC
  Administered 2019-11-09: 25 mg via ORAL
  Filled 2019-11-09: qty 1

## 2019-11-09 MED ORDER — METOCLOPRAMIDE HCL 10 MG PO TABS: 10.0000 mg | ORAL_TABLET | Freq: Four times a day (QID) | ORAL | 0 refills | Status: DC | PRN

## 2019-11-09 MED ORDER — METOCLOPRAMIDE HCL 5 MG/ML IJ SOLN
10.0000 mg | Freq: Once | INTRAMUSCULAR | Status: AC
  Administered 2019-11-09: 10 mg via INTRAVENOUS
  Filled 2019-11-09: qty 2

## 2019-11-09 MED ORDER — DIPHENHYDRAMINE HCL 50 MG/ML IJ SOLN
25.0000 mg | Freq: Once | INTRAMUSCULAR | Status: AC
  Administered 2019-11-09: 25 mg via INTRAVENOUS
  Filled 2019-11-09: qty 1

## 2019-11-09 MED ORDER — SODIUM CHLORIDE 0.9 % IV BOLUS
1000.0000 mL | Freq: Once | INTRAVENOUS | Status: AC
  Administered 2019-11-09: 1000 mL via INTRAVENOUS

## 2019-11-09 MED ORDER — MECLIZINE HCL 25 MG PO TABS: 25.0000 mg | ORAL_TABLET | Freq: Three times a day (TID) | ORAL | 0 refills | Status: DC | PRN

## 2019-11-09 NOTE — ED Notes (Signed)
Pt reports feeling better

## 2019-11-09 NOTE — ED Triage Notes (Signed)
Dizziness, Nausea, vomited 4-5x that began today. No hx of such. States hx of 'standing up to fast and getting lightheaded'

## 2019-11-09 NOTE — ED Provider Notes (Signed)
[facility] EMERGENCY DEPARTMENT Provider Note   CSN: [id number] Arrival date & time: 11/09/19  1941     History Chief Complaint  Patient presents with  . Dizziness    Naavya Raul Del is a 29 y.o. female here presenting with dizziness.  Patient states that she woke up today and felt lightheaded and dizzy.  She states that then the room started spinning especially when she stands up.  She denies any weakness or trouble speaking.  She states that she is still able to walk.  She states that she also started vomiting and took her husband Zofran with no relief.  She states that she never had this happen before.  She has no history of stroke in the past.  The history is provided by the patient.       Past Medical History:  Diagnosis Date  . Joint pain   . Medical history non-contributory     Patient Active Problem List   Diagnosis Date Noted  . Indication for care in labor or delivery 11/15/2014  . Fetal heart rate nonreactive   . [redacted] weeks gestation of pregnancy     Past Surgical History:  Procedure Laterality Date  . NO PAST SURGERIES       OB History    Gravida  1   Para  1   Term  1   Preterm      AB      Living  1     SAB      TAB      Ectopic      Multiple  0   Live Births  1           History reviewed. No pertinent family history.  Social History   Tobacco Use  . Smoking status: Former Games developer  . Smokeless tobacco: Never Used  Substance Use Topics  . Alcohol use: Yes    Comment: NONE    DUTING  PREG  . Drug use: No    Home Medications Prior to Admission medications   Medication Sig Start Date End Date Taking? Authorizing Provider  acetaminophen (TYLENOL) 325 MG tablet Take 325 mg by mouth every 6 (six) hours as needed for mild pain.     [provider]  ibuprofen (ADVIL,MOTRIN) 600 MG tablet Take 1 tablet (600 mg total) by mouth every 6 (six) hours. 11/17/14   Myna Hidalgo, DO  loratadine (CLARITIN) 10 MG tablet Take  10 mg by mouth daily.    [provider]  Prenatal Vit-Fe Fumarate-FA (PRENATAL MULTIVITAMIN) TABS tablet Take 1 tablet by mouth daily at 12 noon.    [provider]  triamcinolone cream (KENALOG) 0.1 % Apply 1 application topically 2 (two) times daily as needed (eczema).    [provider]    Allergies    Patient has no known allergies.  Review of Systems   Review of Systems  Neurological: Positive for dizziness.  All other systems reviewed and are negative.   Physical Exam Updated Vital Signs BP 120/80   Pulse 74   Temp 98.1 F (36.7 C) (Oral)   Resp 16   Ht 5\' 7"  (1.702 m)   Wt 104.3 kg   SpO2 100%   BMI 36.02 kg/m   Physical Exam Constitutional:      Appearance: Normal appearance.  HENT:     Head: Normocephalic.     Right Ear: Tympanic membrane normal.     Left Ear: Tympanic membrane normal.  Nose: Nose normal.     Mouth/Throat:     Mouth: Mucous membranes are moist.  Eyes:     Comments: ? Nystagmus to the right   Cardiovascular:     Rate and Rhythm: Normal rate and regular rhythm.     Pulses: Normal pulses.     Heart sounds: Normal heart sounds.  Pulmonary:     Effort: Pulmonary effort is normal.     Breath sounds: Normal breath sounds.  Abdominal:     General: Abdomen is flat.     Palpations: Abdomen is soft.  Musculoskeletal:        General: Normal range of motion.     Cervical back: Normal range of motion.  Skin:    General: Skin is warm.     Capillary Refill: Capillary refill takes less than 2 seconds.  Neurological:     General: No focal deficit present.     Mental Status: She is alert and oriented to person, place, and time.     Comments: Cranial nerves II through XII intact.  Normal strength and sensation bilaterally.  No pronator drift.  Normal finger-to-nose bilaterally.  Psychiatric:        Mood and Affect: Mood normal.        Behavior: Behavior normal.     ED Results / Procedures / Treatments   Labs (all  labs ordered are listed, but only abnormal results are displayed) Labs Reviewed  COMPREHENSIVE METABOLIC PANEL - Abnormal; Notable for the following components:      Result Value   Sodium 133 (*)    Glucose, Bld 136 (*)    Total Protein 10.3 (*)    All other components within normal limits  CBC - Abnormal; Notable for the following components:   Hemoglobin 11.7 (*)    HCT 35.7 (*)    All other components within normal limits  URINALYSIS, ROUTINE W REFLEX MICROSCOPIC - Abnormal; Notable for the following components:   APPearance HAZY (*)    Hgb urine dipstick LARGE (*)    Protein, ur 30 (*)    Leukocytes,Ua TRACE (*)    All other components within normal limits  URINALYSIS, MICROSCOPIC (REFLEX) - Abnormal; Notable for the following components:   Bacteria, UA MANY (*)    All other components within normal limits  LIPASE, BLOOD  PREGNANCY, URINE    EKG None  Radiology No results found.  Procedures Procedures (including critical care time)  Medications Ordered in ED Medications  sodium chloride 0.9 % bolus 1,000 mL (1,000 mLs Intravenous New Bag/Given 11/09/19 2217)  metoCLOPramide (REGLAN) injection 10 mg (10 mg Intravenous Given 11/09/19 2218)  diphenhydrAMINE (BENADRYL) injection 25 mg (25 mg Intravenous Given 11/09/19 2218)  meclizine (ANTIVERT) tablet 25 mg (25 mg Oral Given 11/09/19 2218)    ED Course  I have reviewed the triage vital signs and the nursing notes.  Pertinent labs & imaging results that were available during my care of the patient were reviewed by me and considered in my medical decision making (see chart for details).    MDM Rules/Calculators/A&P                          Jaskiran Raul Del is a 29 y.o. female who presented with dizziness and vertigo. I think likely peripheral vertigo.  Patient had a possible nystagmus to the right on my exam.  I have low suspicion for central vertigo.  Plan to check CBC and BMP and get  orthostatics and hydrate patient and  give Reglan and meclizine and reassess.  10:53 PM Labs unremarkable.  Patient's urine is contaminated and she has no symptoms .  Patient received Reglan and meclizine and felt better. Likely peripheral vertigo. Will dc home with reglan and meclizine.   Final Clinical Impression(s) / ED Diagnoses Final diagnoses:  None    Rx / DC Orders ED Discharge Orders    None       Charlynne Pander, MD 11/09/19 2254

## 2019-11-09 NOTE — Discharge Instructions (Addendum)
You likely have some vertigo.  Take meclizine for dizziness.  Take Reglan for nausea and vomiting.  Stay hydrated.  Follow-up with your primary care doctor.  Avoid sudden head movements.  Return to ER if you have worse dizziness, headache, vomiting, weakness.

## 2019-11-09 NOTE — ED Notes (Signed)
ED Provider at bedside. 

## 2021-02-13 LAB — OB RESULTS CONSOLE HEPATITIS B SURFACE ANTIGEN: Hepatitis B Surface Ag: NEGATIVE

## 2021-03-14 ENCOUNTER — Other Ambulatory Visit: Payer: Self-pay

## 2021-03-14 ENCOUNTER — Encounter (HOSPITAL_COMMUNITY): Payer: Self-pay | Admitting: *Deleted

## 2021-03-14 ENCOUNTER — Inpatient Hospital Stay (HOSPITAL_COMMUNITY)
Admission: AD | Admit: 2021-03-14 | Discharge: 2021-03-14 | Disposition: A | Discharge status: Home / Self Care | Payer: 59 | Attending: Emergency Medicine | Admitting: Emergency Medicine

## 2021-03-14 ENCOUNTER — Inpatient Hospital Stay (HOSPITAL_COMMUNITY): Payer: 59

## 2021-03-14 DIAGNOSIS — R200 Anesthesia of skin: Secondary | ICD-10-CM | POA: Diagnosis not present

## 2021-03-14 DIAGNOSIS — O26891 Other specified pregnancy related conditions, first trimester: Secondary | ICD-10-CM | POA: Insufficient documentation

## 2021-03-14 DIAGNOSIS — Z3A13 13 weeks gestation of pregnancy: Secondary | ICD-10-CM

## 2021-03-14 DIAGNOSIS — Z79899 Other long term (current) drug therapy: Secondary | ICD-10-CM | POA: Insufficient documentation

## 2021-03-14 DIAGNOSIS — R531 Weakness: Secondary | ICD-10-CM | POA: Insufficient documentation

## 2021-03-14 DIAGNOSIS — R0789 Other chest pain: Secondary | ICD-10-CM

## 2021-03-14 DIAGNOSIS — Z87891 Personal history of nicotine dependence: Secondary | ICD-10-CM | POA: Insufficient documentation

## 2021-03-14 DIAGNOSIS — R0602 Shortness of breath: Secondary | ICD-10-CM | POA: Insufficient documentation

## 2021-03-14 LAB — BASIC METABOLIC PANEL
Anion gap: 7 (ref 5–15)
BUN: 6 mg/dL (ref 6–20)
CO2: 23 mmol/L (ref 22–32)
Calcium: 8.9 mg/dL (ref 8.9–10.3)
Chloride: 100 mmol/L (ref 98–111)
Creatinine, Ser: 0.57 mg/dL (ref 0.44–1.00)
GFR, Estimated: >60 mL/min (ref >60)
Glucose, Bld: 89 mg/dL (ref 70–99)
Potassium: 3.3 mmol/L — ABNORMAL LOW (ref 3.5–5.1)
Sodium: 130 mmol/L — ABNORMAL LOW (ref 135–145)

## 2021-03-14 LAB — CBC WITH DIFFERENTIAL/PLATELET
Abs Immature Granulocytes: 0.03 10*3/uL (ref 0.00–0.07)
Basophils Absolute: 0 10*3/uL (ref 0.0–0.1)
Basophils Relative: 0 %
Eosinophils Absolute: 0 10*3/uL (ref 0.0–0.5)
Eosinophils Relative: 1 %
HCT: 29.7 % — ABNORMAL LOW (ref 36.0–46.0)
Hemoglobin: 9.8 g/dL — ABNORMAL LOW (ref 12.0–15.0)
Immature Granulocytes: 0 %
Lymphocytes Relative: 15 %
Lymphs Abs: 1.1 10*3/uL (ref 0.7–4.0)
MCH: 30.2 pg (ref 26.0–34.0)
MCHC: 33 g/dL (ref 30.0–36.0)
MCV: 91.7 fL (ref 80.0–100.0)
Monocytes Absolute: 0.4 10*3/uL (ref 0.1–1.0)
Monocytes Relative: 6 %
Neutro Abs: 5.8 10*3/uL (ref 1.7–7.7)
Neutrophils Relative %: 78 %
Platelets: 262 10*3/uL (ref 150–400)
RBC: 3.24 MIL/uL — ABNORMAL LOW (ref 3.87–5.11)
RDW: 11.9 % (ref 11.5–15.5)
WBC: 7.4 10*3/uL (ref 4.0–10.5)
nRBC: 0 % (ref 0.0–0.2)

## 2021-03-14 LAB — TROPONIN I (HIGH SENSITIVITY)
Troponin I (High Sensitivity): 5 ng/L (ref <18)
Troponin I (High Sensitivity): 4 ng/L (ref <18)

## 2021-03-14 LAB — D-DIMER, QUANTITATIVE: D-Dimer, Quant: 1.09 ug/mL-FEU — ABNORMAL HIGH (ref 0.00–0.50)

## 2021-03-14 LAB — SEDIMENTATION RATE: Sed Rate: 130 mm/hr — ABNORMAL HIGH (ref 0–22)

## 2021-03-14 MED ORDER — SUCRALFATE 1 G PO TABS: 1.0000 g | ORAL_TABLET | Freq: Three times a day (TID) | ORAL | 0 refills | Status: DC

## 2021-03-14 MED ORDER — FAMOTIDINE IN NACL 20-0.9 MG/50ML-% IV SOLN
20.0000 mg | Freq: Once | INTRAVENOUS | Status: AC
  Administered 2021-03-14: 20 mg via INTRAVENOUS
  Filled 2021-03-14: qty 50

## 2021-03-14 MED ORDER — SUCRALFATE 1 GM/10ML PO SUSP
1.0000 g | Freq: Once | ORAL | Status: AC
  Administered 2021-03-14: 1 g via ORAL
  Filled 2021-03-14 (×2): qty 10

## 2021-03-14 MED ORDER — IOHEXOL 350 MG/ML SOLN
65.0000 mL | Freq: Once | INTRAVENOUS | Status: AC | PRN
  Administered 2021-03-14: 65 mL via INTRAVENOUS

## 2021-03-14 MED ORDER — FAMOTIDINE 20 MG PO TABS: 20.0000 mg | ORAL_TABLET | Freq: Two times a day (BID) | ORAL | 0 refills | Status: DC

## 2021-03-14 MED ORDER — SODIUM CHLORIDE 0.9 % IV BOLUS
500.0000 mL | Freq: Once | INTRAVENOUS | Status: AC
  Administered 2021-03-14: 500 mL via INTRAVENOUS

## 2021-03-14 NOTE — ED Notes (Signed)
Patient transported to CT 

## 2021-03-14 NOTE — MAU Note (Signed)
MSE completed by J.Rasch,NP. Report called to ED charge nurse Sarah,RN

## 2021-03-14 NOTE — MAU Provider Note (Signed)
Chief Complaint: Chest Pain and Shortness of Breath   None     SUBJECTIVE HPI: Kaylee Armstrong is a 30 y.o. G2P1001 who presents to maternity admissions for chest fullness and right arm numbness and tingling. Symptoms started this morning at 0430. She took tylenol and the symptoms improved some. She has mild symptoms currently, however feels like she needs to remove pressure from her chest.     Past Medical History:  Diagnosis Date   Joint pain    Medical history non-contributory    Past Surgical History:  Procedure Laterality Date   NO PAST SURGERIES     Social History   Socioeconomic History   Marital status: Married    Spouse name: Not on file   Number of children: Not on file   Years of education: Not on file   Highest education level: Not on file  Occupational History   Not on file  Tobacco Use   Smoking status: Former   Smokeless tobacco: Never  Substance and Sexual Activity   Alcohol use: Yes    Comment: NONE    DUTING  PREG   Drug use: No   Sexual activity: Not on file  Other Topics Concern   Not on file  Social History Narrative   Not on file   Social Determinants of Health   Financial Resource Strain: Not on file  Food Insecurity: Not on file  Transportation Needs: Not on file  Physical Activity: Not on file  Stress: Not on file  Social Connections: Not on file  Intimate Partner Violence: Not on file   No current facility-administered medications on file prior to encounter.   Current Outpatient Medications on File Prior to Encounter  Medication Sig Dispense Refill   acetaminophen (TYLENOL) 325 MG tablet Take 325 mg by mouth every 6 (six) hours as needed for mild pain.      ibuprofen (ADVIL,MOTRIN) 600 MG tablet Take 1 tablet (600 mg total) by mouth every 6 (six) hours. 30 tablet 0   loratadine (CLARITIN) 10 MG tablet Take 10 mg by mouth daily.     meclizine (ANTIVERT) 25 MG tablet Take 1 tablet (25 mg total) by mouth 3 (three) times daily as needed  for dizziness. 10 tablet 0   metoCLOPramide (REGLAN) 10 MG tablet Take 1 tablet (10 mg total) by mouth every 6 (six) hours as needed for nausea (nausea/headache). 8 tablet 0   Prenatal Vit-Fe Fumarate-FA (PRENATAL MULTIVITAMIN) TABS tablet Take 1 tablet by mouth daily at 12 noon.     triamcinolone cream (KENALOG) 0.1 % Apply 1 application topically 2 (two) times daily as needed (eczema).     No Known Allergies  ROS:  Review of Systems  Respiratory:  Positive for chest tightness and shortness of breath. Negative for cough.   Cardiovascular:  Negative for chest pain.   I have reviewed patient's Past Medical Hx, Surgical Hx, Family Hx, Social Hx, medications and allergies.   Physical Exam  Patient Vitals for the past 24 hrs:  BP Temp Pulse Resp Weight  03/14/21 1248 129/63 98.7 F (37.1 C) 97 18 102.1 kg   Physical Exam Constitutional:      General: She is not in acute distress.    Appearance: She is well-developed. She is obese. She is not ill-appearing, toxic-appearing or diaphoretic.  Neurological:     Mental Status: She is alert and oriented to person, place, and time.   MDM  MSE done Discussed patient with ER MD, ok for transfer  for further workup.   ASSESSMENT MSE Complete  PLAN  Transfer patient to [facility] ED via wheelchair.   Duane Lope, NP 03/14/2021 12:56 PM

## 2021-03-14 NOTE — ED Triage Notes (Signed)
Pt reports being approx [redacted] weeks pregnant, began having some sob this morning, an episode of chest discomfort and her "right arm felt weak" this morning.

## 2021-03-14 NOTE — ED Provider Notes (Signed)
[facility] EMERGENCY DEPARTMENT Provider Note   CSN: [id number] Arrival date & time: 03/14/21  1233     History Chief Complaint  Patient presents with   Shortness of Breath   Weakness    Kaylee Armstrong is a 30 y.o. female.  HPI Patient presents from our maternal admissions unit after initially presenting there due to chest pain early pregnancy.  She is approximately 12 weeks into this pregnancy, has been relatively well throughout, no nausea, vomiting.  However, over the past week she has developed chest pain, pressure-like, with dyspnea, worse with activity and positioning.  No syncope, though she has had that in the past.  She has no known history of cardiac disease, nor history of thromboembolism. According to the patient and chart review the patient had full evaluation at maternal admissions unit with appropriate heart tones, and no current evidence for complication of pregnancy. With ongoing chest pain she was sent here for evaluation.    Past Medical History:  Diagnosis Date   Joint pain    Medical history non-contributory     Patient Active Problem List   Diagnosis Date Noted   Indication for care in labor or delivery 11/15/2014   Fetal heart rate nonreactive    [redacted] weeks gestation of pregnancy     Past Surgical History:  Procedure Laterality Date   NO PAST SURGERIES       OB History     Gravida  2   Para  1   Term  1   Preterm      AB      Living  1      SAB      IAB      Ectopic      Multiple  0   Live Births  1           History reviewed. No pertinent family history.  Social History   Tobacco Use   Smoking status: Former   Smokeless tobacco: Never  Substance Use Topics   Alcohol use: Yes    Comment: NONE    DUTING  PREG   Drug use: No    Home Medications Prior to Admission medications   Medication Sig Start Date End Date Taking? Authorizing Provider  acetaminophen (TYLENOL) 325 MG tablet Take 325 mg by  mouth every 6 (six) hours as needed for mild pain.    Yes [provider]  DUPIXENT 300 MG/2ML SOPN Inject 300 mg into the skin every 14 (fourteen) days. 02/24/21  Yes [provider]  famotidine (PEPCID) 20 MG tablet Take 1 tablet (20 mg total) by mouth 2 (two) times daily. 03/14/21  Yes Gerhard Munch, MD  loratadine (CLARITIN) 10 MG tablet Take 10 mg by mouth daily as needed for allergies.   Yes [provider]  OPZELURA 1.5 % CREA Apply 1 Applicatorful topically 2 (two) times daily as needed for dry skin. 12/07/20  Yes [provider]  Prenatal Vit-Fe Fumarate-FA (PRENATAL MULTIVITAMIN) TABS tablet Take 1 tablet by mouth daily at 12 noon.   Yes [provider]  sucralfate (CARAFATE) 1 g tablet Take 1 tablet (1 g total) by mouth 4 (four) times daily -  with meals and at bedtime. 03/14/21  Yes Gerhard Munch, MD  triamcinolone cream (KENALOG) 0.1 % Apply 1 application topically 2 (two) times daily as needed (eczema).   Yes [provider]    Allergies    Patient has no known allergies.  Review of  Systems   Review of Systems  Constitutional:        Per HPI, otherwise negative  HENT:         Per HPI, otherwise negative  Respiratory:         Per HPI, otherwise negative  Cardiovascular:        Per HPI, otherwise negative  Gastrointestinal:  Negative for vomiting.  Endocrine:       Negative aside from HPI  Genitourinary:        Neg aside from HPI   Musculoskeletal:        Per HPI, otherwise negative  Skin: Negative.   Neurological:  Positive for syncope (Last last about 2 years ago, not while pregnant).   Physical Exam Updated Vital Signs BP 108/62   Pulse 96   Temp 98.5 F (36.9 C) (Oral)   Resp 20   Wt 102.1 kg   SpO2 100%   BMI 35.24 kg/m   Physical Exam Vitals and nursing note reviewed.  Constitutional:      General: She is not in acute distress.    Appearance: She is well-developed. She is obese.  HENT:      Head: Normocephalic and atraumatic.  Eyes:     Conjunctiva/sclera: Conjunctivae normal.  Cardiovascular:     Rate and Rhythm: Normal rate and regular rhythm.  Pulmonary:     Effort: Pulmonary effort is normal. No respiratory distress.     Breath sounds: Normal breath sounds. No stridor.  Abdominal:     General: There is no distension.     Tenderness: There is no abdominal tenderness. There is no guarding.  Skin:    General: Skin is warm and dry.  Neurological:     Mental Status: She is alert and oriented to person, place, and time.     Cranial Nerves: No cranial nerve deficit.    ED Results / Procedures / Treatments   Labs (all labs ordered are listed, but only abnormal results are displayed) Labs Reviewed  CBC WITH DIFFERENTIAL/PLATELET - Abnormal; Notable for the following components:      Result Value   RBC 3.24 (*)    Hemoglobin 9.8 (*)    HCT 29.7 (*)    All other components within normal limits  BASIC METABOLIC PANEL - Abnormal; Notable for the following components:   Sodium 130 (*)    Potassium 3.3 (*)    All other components within normal limits  D-DIMER, QUANTITATIVE - Abnormal; Notable for the following components:   D-Dimer, Quant 1.09 (*)    All other components within normal limits  SEDIMENTATION RATE - Abnormal; Notable for the following components:   Sed Rate 130 (*)    All other components within normal limits  TROPONIN I (HIGH SENSITIVITY)  TROPONIN I (HIGH SENSITIVITY)    EKG None  Radiology CT Angio Chest PE W and/or Wo Contrast  Result Date: 03/14/2021 CLINICAL DATA:  Pregnant. Pulmonary embolism, positive D-dimer, chest pain, dyspnea. EXAM: CT ANGIOGRAPHY CHEST WITH CONTRAST TECHNIQUE: Multidetector CT imaging of the chest was performed using the standard protocol during bolus administration of intravenous contrast. Multiplanar CT image reconstructions and MIPs were obtained to evaluate the vascular anatomy. CONTRAST:  49mL OMNIPAQUE IOHEXOL 350  MG/ML SOLN COMPARISON:  None. FINDINGS: Cardiovascular: Pulmonary arterial opacification is slightly suboptimal, however, the examination is adequate for exclusion of intraluminal filling defect through the proximal segmental arteries bilaterally. The distal segmental and subsegmental arteries are not well opacified on this examination. No intraluminal filling  defect identified within the appropriately opacified segments. The central pulmonary arteries are of normal caliber. Cardiac size within normal limits. No pericardial effusion. The thoracic aorta is unremarkable. Mediastinum/Nodes: No enlarged mediastinal, hilar, or axillary lymph nodes. Thyroid gland, trachea, and esophagus demonstrate no significant findings. Lungs/Pleura: Lungs are clear. No pleural effusion or pneumothorax. Upper Abdomen: No acute abnormality. Musculoskeletal: No chest wall abnormality. No acute or significant osseous findings. Review of the MIP images confirms the above findings. IMPRESSION: Slightly limited examination without evidence of intraluminal filling defect to suggest acute pulmonary embolism through the proximal segmental pulmonary arterial vasculature. No acute intrathoracic pathology identified. Electronically Signed   By: Helyn Numbers M.D.   On: 03/14/2021 21:40    Procedures Procedures   Medications Ordered in ED Medications  sodium chloride 0.9 % bolus 500 mL (0 mLs Intravenous Stopped 03/14/21 2158)  famotidine (PEPCID) IVPB 20 mg premix (0 mg Intravenous Stopped 03/14/21 1805)  sucralfate (CARAFATE) 1 GM/10ML suspension 1 g (1 g Oral Given 03/14/21 1720)  iohexol (OMNIPAQUE) 350 MG/ML injection 65 mL (65 mLs Intravenous Contrast Given 03/14/21 2128)    ED Course  I have reviewed the triage vital signs and the nursing notes.  Pertinent labs & imaging results that were available during my care of the patient were reviewed by me and considered in my medical decision making (see chart for details).    10:12 PM D-dimer positive, other labs reassuring.  She remains in no distress, though continues to complain of chest pain on repeat exam.  We discussed indication for imaging, patient amenable to this.  10:12 PM Patient now committed by her husband.  Together we discussed all findings including CT which I reviewed, no evidence for pulmonary embolism.  Labs reassuring aside from elevated D-dimer.  Given her description of pain in the sternal area, pregnancy, some suspicion for gastroesophageal etiology.  Findings otherwise reassuring, no evidence for infection, bacteremia, sepsis.  Negative studies for PE, low suspicion for atypical ACS.  Patient appropriate for amenable to discharge with outpatient OB follow-up with ongoing GI meds. MDM Rules/Calculators/A&P MDM Number of Diagnoses or Management Options Chest pressure: new, needed workup   Amount and/or Complexity of Data Reviewed Clinical lab tests: ordered and reviewed Tests in the radiology section of CPT: ordered and reviewed Tests in the medicine section of CPT: reviewed and ordered Decide to obtain previous medical records or to obtain history from someone other than the patient: yes Obtain history from someone other than the patient: yes Review and summarize past medical records: yes Independent visualization of images, tracings, or specimens: yes  Risk of Complications, Morbidity, and/or Mortality Presenting problems: high Diagnostic procedures: high Management options: high  Critical Care Total time providing critical care: < 30 minutes  Patient Progress Patient progress: stable   Final Clinical Impression(s) / ED Diagnoses Final diagnoses:  Chest pressure  [redacted] weeks gestation of pregnancy    Rx / DC Orders ED Discharge Orders          Ordered    famotidine (PEPCID) 20 MG tablet  2 times daily        03/14/21 2201    sucralfate (CARAFATE) 1 g tablet  3 times daily with meals & bedtime       Note to Pharmacy: Take  for one week   03/14/21 2201             Gerhard Munch, MD 03/14/21 2213

## 2021-03-14 NOTE — MAU Note (Signed)
Pt reports she woke up around 4am with chest tightness and some SOB. Stated she had some numbness in her right arm. Went away around [place].  Still feeling a little SOb. Called OB and told to come in.

## 2021-03-14 NOTE — Discharge Instructions (Signed)
As discussed, your evaluation today has been largely reassuring.  But, it is important that you monitor your condition carefully, and do not hesitate to return to the ED if you develop new, or concerning changes in your condition. ? ?Otherwise, please follow-up with your physician for appropriate ongoing care. ? ?

## 2021-03-14 NOTE — ED Provider Notes (Signed)
Emergency Medicine Provider Triage Evaluation Note  Kaylee Armstrong , a 30 y.o. female  was evaluated in triage.  Pt sent to the emergency department from MAU after presenting to the [facility] with complaint of shortness of breath, chest pressure and arm numbness.  All of this started at 430 this morning.  It resolved however while up there she began to feel uncomfortable again.  Can localize the chest discomfort to the middle of her chest.  [redacted] weeks pregnant.  Review of Systems  As above  Physical Exam  BP 110/67 (BP Location: Left Arm)   Pulse 97   Temp 98.7 F (37.1 C) (Oral)   Resp 18   Wt 102.1 kg   SpO2 99%   BMI 35.24 kg/m  Gen:   Awake, no distress   Resp:  Normal effort  MSK:   Moves extremities without difficulty  Other:  RRR, CTAB  Medical Decision Making  Medically screening exam initiated at 2:10 PM.  Appropriate orders placed.  Suad Autrey was informed that the remainder of the evaluation will be completed by another provider, this initial triage assessment does not replace that evaluation, and the importance of remaining in the ED until their evaluation is complete.     Saddie Benders, PA-C 03/14/21 1412    Rozelle Logan, DO 03/14/21 [id number]

## 2021-04-25 ENCOUNTER — Other Ambulatory Visit: Payer: Self-pay | Admitting: Obstetrics and Gynecology

## 2021-04-25 DIAGNOSIS — Z363 Encounter for antenatal screening for malformations: Secondary | ICD-10-CM

## 2021-04-27 ENCOUNTER — Encounter: Payer: Self-pay | Admitting: *Deleted

## 2021-04-29 NOTE — L&D Delivery Note (Signed)
Delivery Note ?Labor onset: 08/31/2021  Labor Onset Time: 0400 ?Complete dilation at 6:56 AM  ?Onset of pushing at 0715 ?FHR second stage Cat 1 ?Analgesia/Anesthesia intrapartum: unmedicated ? ?Voluntary pushing with strong maternal urge. Delivery of a viable female at 45. Fetal head delivered in LOA position.  ?Nuchal cord: none.  ?Infant placed on maternal abd, dried, and tactile stim.  ?Cord double clamped after 1 min for increased maternal bleeding,  and cut by Gwyndolyn Saxon, father.  ?RN x3 present for birth. ? ?Cord blood sample collected: Yes ?Arterial cord blood sample collected: No ? ?Placenta delivered Delena Bali, intact, with 3 VC.  ?Placenta to L&D. ?Uterine tone boggy, bleeding brisk. Pitocin, TXA, Cytotec 1000 mcg rectally, Methergine IM ? ?1st degree hemostatic laceration identified. No repair required  ?QBL/EBL (mL): 650 ?Complications: PPH ?APGAR: APGAR (1 MIN): 9  ?APGAR (5 MINS):  9 ?APGAR (10 MINS):   ?Mom to postpartum.  Baby to Couplet care / Skin to Skin. ? ?Arrie Eastern MSN, CNM ?08/31/2021, 8:05 AM ? ? ?

## 2021-05-02 ENCOUNTER — Other Ambulatory Visit: Payer: Self-pay | Admitting: *Deleted

## 2021-05-02 ENCOUNTER — Encounter: Payer: Self-pay | Admitting: *Deleted

## 2021-05-02 ENCOUNTER — Other Ambulatory Visit: Payer: Self-pay | Admitting: Obstetrics and Gynecology

## 2021-05-02 ENCOUNTER — Ambulatory Visit: Payer: Self-pay | Attending: Obstetrics and Gynecology

## 2021-05-02 ENCOUNTER — Other Ambulatory Visit: Payer: Self-pay

## 2021-05-02 ENCOUNTER — Ambulatory Visit: Payer: Self-pay | Admitting: *Deleted

## 2021-05-02 ENCOUNTER — Ambulatory Visit (HOSPITAL_BASED_OUTPATIENT_CLINIC_OR_DEPARTMENT_OTHER): Payer: Self-pay | Admitting: Obstetrics and Gynecology

## 2021-05-02 VITALS — BP 109/65 | HR 80

## 2021-05-02 DIAGNOSIS — O35BXX0 Maternal care for other (suspected) fetal abnormality and damage, fetal cardiac anomalies, not applicable or unspecified: Secondary | ICD-10-CM

## 2021-05-02 DIAGNOSIS — Z6834 Body mass index (BMI) 34.0-34.9, adult: Secondary | ICD-10-CM | POA: Insufficient documentation

## 2021-05-02 DIAGNOSIS — O26899 Other specified pregnancy related conditions, unspecified trimester: Secondary | ICD-10-CM

## 2021-05-02 DIAGNOSIS — O0992 Supervision of high risk pregnancy, unspecified, second trimester: Secondary | ICD-10-CM

## 2021-05-02 DIAGNOSIS — O361920 Maternal care for other isoimmunization, second trimester, not applicable or unspecified: Secondary | ICD-10-CM

## 2021-05-02 DIAGNOSIS — E669 Obesity, unspecified: Secondary | ICD-10-CM

## 2021-05-02 DIAGNOSIS — O99212 Obesity complicating pregnancy, second trimester: Secondary | ICD-10-CM | POA: Insufficient documentation

## 2021-05-02 DIAGNOSIS — Z363 Encounter for antenatal screening for malformations: Secondary | ICD-10-CM

## 2021-05-02 DIAGNOSIS — Z3A20 20 weeks gestation of pregnancy: Secondary | ICD-10-CM

## 2021-05-02 DIAGNOSIS — Z0375 Encounter for suspected cervical shortening ruled out: Secondary | ICD-10-CM

## 2021-05-02 NOTE — Progress Notes (Signed)
Maternal-Fetal Medicine   Name: Kaylee Armstrong DOB: 01/10/1991 MRN: [id number] Referring Provider: Steva Ready, MD  I had the pleasure of seeing [facility] Armstrong [facility] at [facility]. She is G2 P1 at 20w 3d gestation and is here for fetal anatomy scan.  Past medical history is significant for unspecified connective tissue disease. Recent labs showed increased anti-SSA (Ro) and anti-SSB (La) antibodies.  Patient does not have diabetes or hypertension or any other chronic medical conditions. Review of systems: No history of severe headache or visual disturbances.  She complains of occasional knee joint pains.  No other joint pains or swelling.   Patient was evaluated at the emergency department on 03/14/2021 for complaints of chest pain.  CT angiography ruled out pulmonary embolism.  Past surgical history: Nil of note. Medications: Prenatal vitamins. Allergies: No known drug allergies. Social history: Denies tobacco or drug or alcohol use.  She has been married 1 year but lived with her husband for 10 years.  He is a father of her first baby. Obstetric history is significant for a term vaginal delivery in 2016 of a female infant weighing 6 pounds at birth.  Her son is in good health. GYN history: No history of abnormal Pap smears or cervical surgeries.  Prenatal course: Patient had opted not to screen for fetal aneuploidies but is considering screening now.  She reports she has an appointment for fetal echocardiography.  Blood pressure today at her office is 109/65 mmHg.  Ultrasound We performed fetal anatomical survey.  Fetal biometry is consistent with the previously established dates.  No markers of aneuploidies or fetal structural defects are seen.  Fetal heart rate and rhythm are normal. Because of the appearance of cervical shortening on transabdominal ultrasound and her complaints of intermittent pelvic pressure, we performed transvaginal ultrasound to evaluate the  cervix.  The cervix measures 2.8 cm, which is within normal range.  A small funneling was seen.  Increased Anti SSA (Ro) and anti-SSB (La) antibodies   Increased antibodies lead more commonly to cutaneous manifestations of neonatal lupus syndrome and less commonly cardiac manifestations including congenital heart block.  Neonatal cutaneous manifestations include scaling, plaque formation and hypopigmentation all of which usually resolve over time. Maternal antibodies are IgG Ro antibodies reactive to Ro and La antigens.  In pregnancy transplacental passage of these antibodies can react with fetal cardiomyocytes and cause inflammation.    Depending on the genetic predisposition and the susceptibility in the fetus, inflammatory reaction can lead to fibrosis and defective conduction mechanism.  Initially, they manifest as first-degree heart block leading to second- or third-degree heart block during pregnancy.    Overall risk is estimated to be less than 5% (about 1% to 2%). I recommend fetal echocardiography. Patient reported that she has an appointment for fetal echocardiography. I counseled the patient on the possible neonatal manifestations.  I also reassured her that only a very small percentage of babies are likely to be affected.    I reassured the patient of normal cervical length measurement. However, because of her symptom of intermittent pelvic pressure, I recommend a follow-up cervical length measurement in 2 weeks. Given that she had term vaginal delivery, the likelihood of cervical insufficiency is low.  Recommendations -An appointment was made for her to return in 2 weeks for transvaginal ultrasound to evaluate the cervix. -Completion of fetal anatomy in 4 weeks. -Patient has an appointment for fetal echocardiography. -Fetal growth assessments at 99- and 36-weeks' gestation. -Patient is considering cell-free  fetal DNA screening.   Thank you for consultation.  If you have any questions  or concerns, please contact me the [facility].  Consultation including face-to-face (more than 50%) counseling 30 minutes.

## 2021-05-15 ENCOUNTER — Ambulatory Visit: Payer: Self-pay | Admitting: *Deleted

## 2021-05-15 ENCOUNTER — Other Ambulatory Visit: Payer: Self-pay | Admitting: Obstetrics and Gynecology

## 2021-05-15 ENCOUNTER — Ambulatory Visit: Payer: Self-pay | Attending: Obstetrics and Gynecology

## 2021-05-15 ENCOUNTER — Other Ambulatory Visit: Payer: Self-pay

## 2021-05-15 VITALS — BP 114/65 | HR 90

## 2021-05-15 DIAGNOSIS — R109 Unspecified abdominal pain: Secondary | ICD-10-CM

## 2021-05-15 DIAGNOSIS — O26899 Other specified pregnancy related conditions, unspecified trimester: Secondary | ICD-10-CM

## 2021-05-15 DIAGNOSIS — R102 Pelvic and perineal pain: Secondary | ICD-10-CM | POA: Insufficient documentation

## 2021-05-15 DIAGNOSIS — Z3A22 22 weeks gestation of pregnancy: Secondary | ICD-10-CM

## 2021-05-15 DIAGNOSIS — O26892 Other specified pregnancy related conditions, second trimester: Secondary | ICD-10-CM

## 2021-05-16 ENCOUNTER — Other Ambulatory Visit: Payer: Self-pay | Admitting: *Deleted

## 2021-05-16 DIAGNOSIS — O26879 Cervical shortening, unspecified trimester: Secondary | ICD-10-CM

## 2021-05-30 ENCOUNTER — Ambulatory Visit (HOSPITAL_BASED_OUTPATIENT_CLINIC_OR_DEPARTMENT_OTHER): Payer: 59 | Admitting: Obstetrics and Gynecology

## 2021-05-30 ENCOUNTER — Inpatient Hospital Stay (HOSPITAL_COMMUNITY)
Admission: AD | Admit: 2021-05-30 | Discharge: 2021-06-02 | DRG: 832 | Disposition: A | Discharge status: Home / Self Care | Payer: 59 | Attending: Obstetrics and Gynecology | Admitting: Obstetrics and Gynecology

## 2021-05-30 ENCOUNTER — Other Ambulatory Visit: Payer: Self-pay | Admitting: *Deleted

## 2021-05-30 ENCOUNTER — Ambulatory Visit (HOSPITAL_BASED_OUTPATIENT_CLINIC_OR_DEPARTMENT_OTHER): Payer: 59

## 2021-05-30 ENCOUNTER — Other Ambulatory Visit: Payer: Self-pay

## 2021-05-30 ENCOUNTER — Other Ambulatory Visit: Payer: Self-pay | Admitting: Obstetrics and Gynecology

## 2021-05-30 ENCOUNTER — Ambulatory Visit: Payer: 59 | Admitting: *Deleted

## 2021-05-30 VITALS — BP 106/63 | HR 90

## 2021-05-30 DIAGNOSIS — O99891 Other specified diseases and conditions complicating pregnancy: Secondary | ICD-10-CM | POA: Diagnosis present

## 2021-05-30 DIAGNOSIS — Z3A24 24 weeks gestation of pregnancy: Secondary | ICD-10-CM | POA: Insufficient documentation

## 2021-05-30 DIAGNOSIS — O99212 Obesity complicating pregnancy, second trimester: Secondary | ICD-10-CM | POA: Diagnosis present

## 2021-05-30 DIAGNOSIS — O321XX0 Maternal care for breech presentation, not applicable or unspecified: Secondary | ICD-10-CM | POA: Diagnosis present

## 2021-05-30 DIAGNOSIS — O26879 Cervical shortening, unspecified trimester: Secondary | ICD-10-CM | POA: Insufficient documentation

## 2021-05-30 DIAGNOSIS — O26899 Other specified pregnancy related conditions, unspecified trimester: Secondary | ICD-10-CM | POA: Insufficient documentation

## 2021-05-30 DIAGNOSIS — O3432 Maternal care for cervical incompetence, second trimester: Secondary | ICD-10-CM | POA: Diagnosis present

## 2021-05-30 DIAGNOSIS — O0992 Supervision of high risk pregnancy, unspecified, second trimester: Secondary | ICD-10-CM | POA: Insufficient documentation

## 2021-05-30 DIAGNOSIS — L209 Atopic dermatitis, unspecified: Secondary | ICD-10-CM | POA: Insufficient documentation

## 2021-05-30 DIAGNOSIS — R109 Unspecified abdominal pain: Secondary | ICD-10-CM | POA: Insufficient documentation

## 2021-05-30 DIAGNOSIS — O26872 Cervical shortening, second trimester: Secondary | ICD-10-CM | POA: Diagnosis present

## 2021-05-30 DIAGNOSIS — Z20822 Contact with and (suspected) exposure to covid-19: Secondary | ICD-10-CM | POA: Diagnosis present

## 2021-05-30 DIAGNOSIS — E669 Obesity, unspecified: Secondary | ICD-10-CM

## 2021-05-30 DIAGNOSIS — Z79899 Other long term (current) drug therapy: Secondary | ICD-10-CM | POA: Insufficient documentation

## 2021-05-30 DIAGNOSIS — R102 Pelvic and perineal pain: Secondary | ICD-10-CM | POA: Insufficient documentation

## 2021-05-30 DIAGNOSIS — M351 Other overlap syndromes: Secondary | ICD-10-CM | POA: Diagnosis present

## 2021-05-30 DIAGNOSIS — L219 Seborrheic dermatitis, unspecified: Secondary | ICD-10-CM | POA: Insufficient documentation

## 2021-05-30 DIAGNOSIS — O3430 Maternal care for cervical incompetence, unspecified trimester: Secondary | ICD-10-CM

## 2021-05-30 DIAGNOSIS — R519 Headache, unspecified: Secondary | ICD-10-CM

## 2021-05-30 DIAGNOSIS — L819 Disorder of pigmentation, unspecified: Secondary | ICD-10-CM | POA: Insufficient documentation

## 2021-05-30 LAB — RESP PANEL BY RT-PCR (FLU A&B, COVID) ARPGX2
Influenza A by PCR: NEGATIVE
Influenza B by PCR: NEGATIVE
SARS Coronavirus 2 by RT PCR: NEGATIVE

## 2021-05-30 LAB — TYPE AND SCREEN
ABO/RH(D): A POS
Antibody Screen: NEGATIVE

## 2021-05-30 LAB — CBC
HCT: 30.6 % — ABNORMAL LOW (ref 36.0–46.0)
Hemoglobin: 10.1 g/dL — ABNORMAL LOW (ref 12.0–15.0)
MCH: 30.4 pg (ref 26.0–34.0)
MCHC: 33 g/dL (ref 30.0–36.0)
MCV: 92.2 fL (ref 80.0–100.0)
Platelets: 289 10*3/uL (ref 150–400)
RBC: 3.32 MIL/uL — ABNORMAL LOW (ref 3.87–5.11)
RDW: 12.5 % (ref 11.5–15.5)
WBC: 7.3 10*3/uL (ref 4.0–10.5)
nRBC: 0 % (ref 0.0–0.2)

## 2021-05-30 MED ORDER — SODIUM CHLORIDE 0.9% FLUSH
3.0000 mL | Freq: Two times a day (BID) | INTRAVENOUS | Status: DC
  Administered 2021-05-30 – 2021-06-02 (×6): 3 mL via INTRAVENOUS

## 2021-05-30 MED ORDER — BETAMETHASONE SOD PHOS & ACET 6 (3-3) MG/ML IJ SUSP
12.0000 mg | INTRAMUSCULAR | Status: AC
  Administered 2021-05-30 – 2021-05-31 (×2): 12 mg via INTRAMUSCULAR
  Filled 2021-05-30 (×2): qty 5

## 2021-05-30 MED ORDER — SODIUM CHLORIDE 0.9% FLUSH: 3.0000 mL | INTRAVENOUS | Status: DC | PRN

## 2021-05-30 MED ORDER — BUTALBITAL-APAP-CAFFEINE 50-325-40 MG PO TABS: 1.0000 | ORAL_TABLET | Freq: Four times a day (QID) | ORAL | Status: DC | PRN

## 2021-05-30 MED ORDER — DOCUSATE SODIUM 100 MG PO CAPS
100.0000 mg | ORAL_CAPSULE | Freq: Every day | ORAL | Status: DC
  Administered 2021-05-30 – 2021-06-02 (×4): 100 mg via ORAL
  Filled 2021-05-30 (×4): qty 1

## 2021-05-30 MED ORDER — SODIUM CHLORIDE 0.9 % IV SOLN: 250.0000 mL | INTRAVENOUS | Status: DC | PRN

## 2021-05-30 MED ORDER — PRENATAL MULTIVITAMIN CH
1.0000 | ORAL_TABLET | Freq: Every day | ORAL | Status: DC
  Administered 2021-06-01 – 2021-06-02 (×2): 1 via ORAL
  Filled 2021-05-30 (×3): qty 1

## 2021-05-30 MED ORDER — ZOLPIDEM TARTRATE 5 MG PO TABS: 5.0000 mg | ORAL_TABLET | Freq: Every evening | ORAL | Status: DC | PRN

## 2021-05-30 MED ORDER — ACETAMINOPHEN 325 MG PO TABS: 650.0000 mg | ORAL_TABLET | ORAL | Status: DC | PRN

## 2021-05-30 MED ORDER — CALCIUM CARBONATE ANTACID 500 MG PO CHEW: 2.0000 | CHEWABLE_TABLET | ORAL | Status: DC | PRN

## 2021-05-30 NOTE — Progress Notes (Signed)
Maternal-Fetal Medicine   Name: Kaylee Armstrong DOB: December 25, 1990 MRN: [id number] Referring Provider: Steva Ready, MD   I had the pleasure of seeing [name] today at the [facility].  She returned for fetal growth assessment and transvaginal evaluation of cervical length.  At her previous ultrasound, the cervix was 2.5 cm.  Patient had opted not to take vaginal progesterone treatment. She has mild intermittent pelvic pressure but no vaginal bleeding.  Her past medical history significant for increased anti-SSA and anti-SSB antibodies.  She has an appointment for fetal echocardiography.  Obstetric history significant for a term vaginal delivery.  Patient did not have any cervical surgeries.  Ultrasound Fetal growth is appropriate for gestational age.  Amniotic fluid is normal good fetal activity seen.  Fetal heart rate and rhythm appear normal.  Cardiac evaluation could still not be completed because of fetal position.  Breech presentation. We performed transvaginal ultrasound.  The cervical canal is completely dilated and no measurable portion of the cervix is seen.  I explained the finding and performed sterile speculum examination.  The cervix and vagina appear normal.  External os appears 1 to 2 cm dilated with the membrane visible just above the external os. Digital examination was not performed.  Cervical insufficiency -I explained the finding with help of diagrams and images.  Cervical insufficiency is associated with increased risk of preterm delivery. Patient may present with preterm premature rupture of membranes. -I discussed the option of inpatient management for a few days.  I discussed the benefit of antenatal corticosteroids for fetal pulmonary maturity. -Vaginal progesterone at this gestational age is of doubtful value.  If patient agrees it may be prescribed.  I discussed with Dr. Connye Burkitt.  Patient agreed to be admitted.  Recommendations -Inpatient  management. -Antenatal corticosteroids (betamethasone course). -Vaginal progesterone may be addressed again after admission. -Reevaluated after 3 to 4 days for discharge.   Thank you for consultation.  If you have any questions or concerns, please contact me the [facility].  Consultation including face-to-face (more than 50%) counseling 30 minutes.

## 2021-05-30 NOTE — H&P (Signed)
HPI: 31 y.o. G2P1001 @ [redacted]w[redacted]d estimated gestational age (as dated by LMP c/w 9 week ultrasound) presents for inpatient observation for cervical shortening on ultrasound.  Patient has been followed by MFM for SSA/SSB antibodies as well as incidental cervical shortening noted on initial [place] on 05/02/2021. Given her previous history of term SVD and using shared decision making with MFM, patient declined vaginal progesterone. Today, on ultrasound the cervical canal is completely dilated and no measurable portion of the cervix visualized. External os appears to be 1-2cm dilated with membrane visible just above external os (exam performed by Dr. Donalee Citrin). Digital exam was not performed. Fetus breech on ultrasound. It was recommended for inpatient observation/admission with antenatal steroids.   Leakage of fluid:  No Vaginal bleeding:  No Contractions:  No Fetal movement:  Yes  Prenatal care has been provided by Dr. Drema Dallas [facility])  ROS:  Denies fevers, chills, chest pain, visual changes, SOB, RUQ/epigastric pain, N/V, dysuria, hematuria, or sudden onset/worsening bilateral LE or facial edema.  Pregnancy complicated by: Cervical shortening Connective tissue disease (positive SSA/SSB antibodies on 03/2021, following with MFM, fetal echo planned Feb 2023) Obesity (BMI 34.5) Headaches (Fioricet PRN)  Prenatal Transfer Tool  Maternal Diabetes: No Genetic Screening: Declined Maternal Ultrasounds/Referrals: Other: Shortened cervix Fetal Ultrasounds or other Referrals:  Referred to Materal Fetal Medicine  Maternal Substance Abuse:  No Significant Maternal Medications:  None Significant Maternal Lab Results: None   Prenatal Labs Blood type:  A Positive Antibody screen:  Negative CBC:  H/H 11/32 Rubella: Immune RPR:  Non-reactive Hep B:  Negative Hep C:  Negative HIV:  Negative GC/CT:  Negative Glucola:  Has not had yet  Immunizations: Tdap: Has not had yet Flu: Counseled,  declines  OBHx:  OB History     Gravida  2   Para  1   Term  1   Preterm      AB      Living  1      SAB      IAB      Ectopic      Multiple  0   Live Births  1          PMHx:  See above Meds:  PNV,  Allergy:  No Known Allergies SurgHx: None SocHx:   Denies Tobacco, ETOH, illicit drugs  O: LMP 123456  Gen. AAOx3, NAD CV.  RRR  Resp. CTAB, no wheezes/rales/rhonchi Abd. Gravid, soft, non-tender throughout, no rebound/guarding Extr.  No bilateral LE edema, no calf tenderness bilaterally  Last [place]:   Indications  High risk pregnancy (Increased anti-SSA        O09.90  and anti-SSB antibodies)  Cervical insufficiency, 2nd                    O34.32  [redacted] weeks gestation of pregnancy                Z3A.24  Antenatal follow-up for nonvisualized fetal    Z36.2  anatomy  Encounter for cervical length                  Z36.86 ---------------------------------------------------------------------- Fetal Evaluation  Num Of Fetuses:         1  Fetal Heart Rate(bpm):  144  Cardiac Activity:       Observed  Presentation:           Breech  Placenta:  Posterior  P. Cord Insertion:      Previously Visualized  Amniotic Fluid  AFI FV:      Within normal limits                              Largest Pocket(cm)                              5.22 ---------------------------------------------------------------------- Biometry  BPD:      56.7  mm     G. Age:  23w 2d         10  %    CI:        66.44   %    70 - 86                                                          FL/HC:      19.7   %    18.7 - 20.9  HC:       223   mm     G. Age:  24w 2d         27  %    HC/AC:      1.15        1.05 - 1.21  AC:      193.8  mm     G. Age:  24w 1d         30  %    FL/BPD:     77.6   %    71 - 87  FL:         44  mm     G. Age:  24w 3d         38  %    FL/AC:      22.7   %    20 - 24  HUM:        40  mm     G. Age:  24w 2d         40  %  LV:        3.2  mm  Est. FW:      672  gm      1 lb 8 oz     31  % ---------------------------------------------------------------------- OB History  Gravidity:    2         Term:   1  Living:       1 ---------------------------------------------------------------------- Gestational Age  LMP:           24w 3d        Date:  12/10/20                 EDD:   09/16/21  U/S Today:     24w 0d                                        EDD:   09/19/21  Best:          24w 3d     Det. By:  LMP  (12/10/20)          EDD:  09/16/21 ---------------------------------------------------------------------- Anatomy  Cranium:               Appears normal         Aortic Arch:            Previously seen  Cavum:                 Previously seen        Ductal Arch:            Not well visualized  Ventricles:            Appears normal         Diaphragm:              Appears normal  Choroid Plexus:        Previously seen        Stomach:                Appears normal, left                                                                        sided  Cerebellum:            Previously seen        Abdomen:                Appears normal  Posterior Fossa:       Previously seen        Abdominal Wall:         Previously seen  Face:                  Orbits and profile     Cord Vessels:           Appears normal (3                         previously seen                                vessel cord)  Lips:                  Previously seen        Kidneys:                Appear normal  Palate:                Not well visualized    Bladder:                Appears normal  Thoracic:              Appears normal         Spine:                  Previously seen  Heart:                 Appears normal         Upper Extremities:      Previously seen                         (4CH,  axis, and                         situs)  RVOT:                  Not well visualized    Lower Extremities:      Previously seen  LVOT:                  Previously seen  Other:  Female gender  previously seen.VC previously visualized. Lenses          previously visualized. Technically difficult due to fetal position. ---------------------------------------------------------------------- Cervix Uterus Adnexa  Cervix  Length:              0  cm.  Funnelled and Dilated at external os 95mm. ----------------------------------------------------------------------   FHT: 155 baseline, moderate variability, + accels,  - decels Toco: none   Labs: see orders  A/P:  31 y.o. G2P1001 @ [redacted]w[redacted]d who is admitted for inpatient observation for cervical insufficiency.  - Admit to [facility] - Admit labs (CBC, T&S, COVID screen) - NSTs q shift - Diet:  Regular - IVF:  SLIV - VTE Prophylaxis:  SCDs - GBS Status:  Unknown, will collect - Presentation:  BREECH on [place] - FLM: Betamethasone x 2 doses ordered - MFM following - Per MFM, vaginal progesterone at this age is of doubtful value but if patient desires it may be prescribed - Re-evaluate 3-4 days for discharge home  Drema Dallas, [place] (office)

## 2021-05-31 ENCOUNTER — Encounter (HOSPITAL_COMMUNITY): Payer: Self-pay | Admitting: Obstetrics and Gynecology

## 2021-05-31 NOTE — Progress Notes (Signed)
FHT Reviewed:  FHT baseline 145bpm, moderate variability, - accel, - decels. No contractions. Reassuring NST for gestational age.  Steva Ready, DO

## 2021-05-31 NOTE — Progress Notes (Signed)
Antepartum Progress Note  Subjective: Patient doing well. No concerns this morning. Endorses good FM. Denies VB, LOF, or CTX.  Denies fevers, chills, chest pain, visual changes, SOB, RUQ/epigastric pain, N/V, dysuria, hematuria, or sudden onset/worsening bilateral LE or facial edema.  Objective: BP 113/64 (BP Location: Left Arm)    Pulse 94    Temp 98 F (36.7 C) (Oral)    Resp 18    Ht 5\' 7"  (1.702 m)    Wt 105.2 kg    LMP 12/10/2020    SpO2 99%    BMI 36.34 kg/m  Gen:  NAD, pleasant and cooperative Pulm:  No increased work of breathing Abd:  Soft, gravid, non-distended, non-tender throughout, no rebound/guarding Ext:  No bilateral LE edema, no bilateral calf tenderness  FHT (2200 on 2/1): 145-150bpm, moderate variability, - accel, - decelerations Toco: None  Results for orders placed or performed during the hospital encounter of 05/30/21  Culture, beta strep (group b only)   Specimen: Vaginal/Rectal; Genital  Result Value Ref Range   Specimen Description VAGINAL/RECTAL    Special Requests NONE    Culture      CULTURE REINCUBATED FOR BETTER GROWTH Performed at [facility], [address] [place], [place] [address]    Report Status PENDING   Resp Panel by RT-PCR (Flu A&B, Covid) Vaginal/Rectal   Specimen: Vaginal/Rectal; Nasopharyngeal(NP) swabs in vial transport medium  Result Value Ref Range   SARS Coronavirus 2 by RT PCR NEGATIVE NEGATIVE   Influenza A by PCR NEGATIVE NEGATIVE   Influenza B by PCR NEGATIVE NEGATIVE  CBC  Result Value Ref Range   WBC 7.3 4.0 - 10.5 K/uL   RBC 3.32 (L) 3.87 - 5.11 MIL/uL   Hemoglobin 10.1 (L) 12.0 - 15.0 g/dL   HCT 30.6 (L) 36.0 - 46.0 %   MCV 92.2 80.0 - 100.0 fL   MCH 30.4 26.0 - 34.0 pg   MCHC 33.0 30.0 - 36.0 g/dL   RDW 12.5 11.5 - 15.5 %   Platelets 289 150 - 400 K/uL   nRBC 0.0 0.0 - 0.2 %  Type and screen [place]  Result Value Ref Range   ABO/RH(D) A POS    Antibody Screen NEG    Sample  Expiration      06/02/2021,2359 Performed at [facility], [address], [place], [place] [address]      A/P: Kaylee Armstrong is a 31 y.o. G2P1001 @ [redacted]w[redacted]d admitted for inpatient observation for cervical insufficiency.  - Doing well, no current signs/symptoms of labor - NSTs qshift - BMZ#1 given at 1715 on 2/1 - GBS: Unknown, collected and pending - Presentation: Breech on [place] on 2/1 - Consented for C/S and blood products if indicated - Per MFM, will monitor inpatient for 3-4 days and reassess for discharge home  Consents: I have explained to the patient that this surgery is performed to deliver their baby or babies through an incision in the abdomen and incision in the uterus.  Prior to surgery, the risks and benefits of the surgery, as well as alternative treatments were discussed.  The risks include, but are not limited to, possible need for cesarean delivery for all future pregnancies, bleeding at the time of surgery that could necessitate a blood transfusion and/or hysterectomy, rupture of the uterus during a future pregnancy that could cause a preterm delivery and/or requiring hysterectomy, infection, damage to surrounding organs and tissues, damage to bladder, damage to ureters, causing kidney damage,  and requiring additional procedures, damage to bowels, resulting in further surgery, postoperative pain, short-term and long-term, scarring on the abdominal wall and intra-abdominally, need for further surgery, development of an incisional hernia, deep vein thrombosis and/or pulmonary embolism, wound infection and/or separation, painful intercourse, urinary leakage, impact on future pregnancies including but not limited to, abnormal location or attachment of the placenta to the uterus, such as placenta previa or accreta, that may necessitate a blood transfusion and/or hysterectomy, impact on total family size, complications the course of which cannot be predicted or prevented, and  death. Patient was consented for blood products.  The patient is aware that bleeding may result in the need for a blood transfusion which includes risk of transmission of HIV (1:2 million), Hepatitis C (1:2 million), and Hepatitis B (1:200 thousand) and transfusion reaction.  Patient voiced understanding of the above risks as well as understanding of indications for blood transfusion.  Drema Dallas, DO

## 2021-06-01 LAB — CULTURE, BETA STREP (GROUP B ONLY)

## 2021-06-01 NOTE — Progress Notes (Signed)
Kaylee Armstrong is a 31 y.o. female  G2P1 admitted for cervical insufficiency. She is currently 24 weeks and 5 days.   Subjective: She denies regular contractions. No abdominal pain. Reports 2 episodes of pain in buttocks. +FM no leakage of fluid or vaginal bleeding. She feels that she can maintain bedrest at home. She lives 17 minutes away.   Vitals:   06/01/21 1210 06/01/21 1601  BP: 115/68 111/66  Pulse: 94 90  Resp: 18 17  Temp: 98.1 F (36.7 C) 97.8 F (36.6 C)  SpO2: 99% 99%   General : Alert and oriented no acute distress.  Lungs: no respiratory distress Abdomen . Gravid nontender Extremities: no signs of dvt.  FHR NST reactive   Toco: no contractions.   A/P: [name] is a 31 y.o. G2P1001 @ [redacted]w[redacted]d admitted for inpatient observation for cervical insufficiency.   - Doing well, no current signs/symptoms of labor - NSTs qshift - BMZ Complete  - GBS: Unknown, collected and pending - Presentation: Breech on [place] on 2/1 - Consented for C/S and blood products if indicated - Per MFM, will monitor inpatient for 3-4 days and reassess for discharge home - pt remains stable overnight plan discharge home on HD #4 06/02/2021 to continue bedrest at home.

## 2021-06-02 NOTE — Discharge Summary (Signed)
Physician Discharge Summary  Patient ID: Kaylee MaxonDeja Armstrong MRN: [id number] DOB/AGE: 32/09/1990 32 y.o.  Admit date: 05/30/2021 Discharge date: 06/02/2021  Admission Diagnoses: Cervical shortening affecting pregnancy.   Discharge Diagnoses:  Principal Problem:   Cervical shortening affecting pregnancy Active Problems:   Mixed connective tissue disease (HCC)   Obesity (BMI 30.0-34.9)   Frequent headaches   Cervical shortening   Discharged Condition: stable  Hospital Course: Pt was admitted on 05/30/2021 after visit with MFM. on ultrasound the cervical canal was completely dilated and no measurable portion of the cervix visualized. External os appears to be 1-2cm dilated with membrane visible just above external os (exam performed by Dr. Judeth CornfieldShankar). Digital exam was not performed. Fetus breech on ultrasound. She was admitted for antenatal steroids and monitored. for progression of labor.  She remained stable without contractions. FHR on NST was reassuring . No contractions were seen on toco. She lives 17 minutes from the hospital and is able to maintain modified bedrest at home.   Consults:  Maternal fetal medicine   Significant Diagnostic Studies: labs:  Results for orders placed or performed during the hospital encounter of 05/30/21 (from the past 72 hour(s))  Culture, beta strep (group b only)     Status: None   Collection Time: 05/30/21  4:53 PM   Specimen: Vaginal/Rectal; Genital  Result Value Ref Range   Specimen Description VAGINAL/RECTAL    Special Requests NONE    Culture      NO GROUP B STREP (S.AGALACTIAE) ISOLATED Performed at [facility], [address] [place], [place] [address]    Report Status 06/01/2021 FINAL   Resp Panel by RT-PCR (Flu A&B, Covid) Vaginal/Rectal     Status: None   Collection Time: 05/30/21  4:54 PM   Specimen: Vaginal/Rectal; Nasopharyngeal(NP) swabs in vial transport medium  Result Value Ref Range   SARS Coronavirus 2 by RT PCR NEGATIVE NEGATIVE     Comment: (NOTE) SARS-CoV-2 target nucleic acids are NOT DETECTED.  The SARS-CoV-2 RNA is generally detectable in upper respiratory specimens during the acute phase of infection. The lowest concentration of SARS-CoV-2 viral copies this assay can detect is 138 copies/mL. A negative result does not preclude SARS-Cov-2 infection and should not be used as the sole basis for treatment or other patient management decisions. A negative result may occur with  improper specimen collection/handling, submission of specimen other than nasopharyngeal swab, presence of viral mutation(s) within the areas targeted by this assay, and inadequate number of viral copies(<138 copies/mL). A negative result must be combined with clinical observations, patient history, and epidemiological information. The expected result is Negative.  Fact Sheet for Patients:  BloggerCourse.comhttps://www.fda.gov/media/152166/download  Fact Sheet for Healthcare Providers:  SeriousBroker.ithttps://www.fda.gov/media/152162/download  This test is no t yet approved or cleared by the [place] FDA and  has been authorized for detection and/or diagnosis of SARS-CoV-2 by FDA under an Emergency Use Authorization (EUA). This EUA will remain  in effect (meaning this test can be used) for the duration of the COVID-19 declaration under Section 564(b)(1) of the Act, 21 U.S.C.section 360bbb-3(b)(1), unless the authorization is terminated  or revoked sooner.       Influenza A by PCR NEGATIVE NEGATIVE   Influenza B by PCR NEGATIVE NEGATIVE    Comment: (NOTE) The Xpert Xpress SARS-CoV-2/FLU/RSV plus assay is intended as an aid in the diagnosis of influenza from Nasopharyngeal swab specimens and should not be used as a sole basis for treatment. Nasal washings and aspirates are unacceptable for Xpert  Xpress SARS-CoV-2/FLU/RSV testing.  Fact Sheet for Patients: BloggerCourse.com  Fact Sheet for Healthcare  Providers: SeriousBroker.it  This test is not yet approved or cleared by the [place] FDA and has been authorized for detection and/or diagnosis of SARS-CoV-2 by FDA under an Emergency Use Authorization (EUA). This EUA will remain in effect (meaning this test can be used) for the duration of the COVID-19 declaration under Section 564(b)(1) of the Act, 21 U.S.C. section 360bbb-3(b)(1), unless the authorization is terminated or revoked.  Performed at [facility], [address] [place], [place] [address]   Type and screen [facility]     Status: None   Collection Time: 05/30/21  5:09 PM  Result Value Ref Range   ABO/RH(D) A POS    Antibody Screen NEG    Sample Expiration      06/02/2021,2359 Performed at [facility], [address] [place], [place] [address]   CBC     Status: Abnormal   Collection Time: 05/30/21  5:09 PM  Result Value Ref Range   WBC 7.3 4.0 - 10.5 K/uL   RBC 3.32 (L) 3.87 - 5.11 MIL/uL   Hemoglobin 10.1 (L) 12.0 - 15.0 g/dL   HCT 65.7 (L) 84.6 - 96.2 %   MCV 92.2 80.0 - 100.0 fL   MCH 30.4 26.0 - 34.0 pg   MCHC 33.0 30.0 - 36.0 g/dL   RDW 95.2 84.1 - 32.4 %   Platelets 289 150 - 400 K/uL   nRBC 0.0 0.0 - 0.2 %    Comment: Performed at [facility], [address], [place], [place] [address]     Treatments: steroids: Betamethasone 12 mg q 24 hours x 2 doses   Discharge Exam: Blood pressure 120/68, pulse 87, temperature 97.9 F (36.6 C), temperature source Oral, resp. rate 18, height 5\' 7"  (1.702 m), weight 105.2 kg, last menstrual period 12/10/2020, SpO2 100 %, unknown if currently breastfeeding. General appearance: alert, cooperative, and no distress Resp: no distress  GI: gravid nontender Extremities: extremities normal, atraumatic, no cyanosis or edema NST this morning. Baseline 150's +accelerations non decelerations.  Toco: no contractions  Disposition: Discharge disposition:  01-Home or Self Care       Discharge Instructions     Discharge activity:  Bathroom / Shower only   Complete by: As directed    Discharge activity: Bedrest   Complete by: As directed    Discharge diet:  No restrictions   Complete by: As directed    Do not have sex or do anything that might make you have an orgasm   Complete by: As directed    Fetal Kick Count:  Lie on our left side for one hour after a meal, and count the number of times your baby kicks.  If it is less than 5 times, get up, move around and drink some juice.  Repeat the test 30 minutes later.  If it is still less than 5 kicks in an hour, notify your doctor.   Complete by: As directed    Notify physician for a general feeling that "something is not right"   Complete by: As directed    Notify physician for increase or change in vaginal discharge   Complete by: As directed    Notify physician for intestinal cramps, with or without diarrhea, sometimes described as "gas pain"   Complete by: As directed    Notify physician for leaking of fluid   Complete by:  As directed    Notify physician for low, dull backache, unrelieved by heat or Tylenol   Complete by: As directed    Notify physician for menstrual like cramps   Complete by: As directed    Notify physician for pelvic pressure   Complete by: As directed    Notify physician for uterine contractions.  These may be painless and feel like the uterus is tightening or the baby is  "balling up"   Complete by: As directed    Notify physician for vaginal bleeding   Complete by: As directed    PRETERM LABOR:  Includes any of the follwing symptoms that occur between 20 - [redacted] weeks gestation.  If these symptoms are not stopped, preterm labor can result in preterm delivery, placing your baby at risk   Complete by: As directed       Allergies as of 06/02/2021   No Known Allergies      Medication List     STOP taking these medications    Dupixent 300 MG/2ML  Sopn Generic drug: Dupilumab   Opzelura 1.5 % Crea Generic drug: Ruxolitinib Phosphate       TAKE these medications    acetaminophen 325 MG tablet Commonly known as: TYLENOL Take 325 mg by mouth every 6 (six) hours as needed for mild pain.   loratadine 10 MG tablet Commonly known as: CLARITIN Take 10 mg by mouth daily as needed for allergies.   prenatal multivitamin Tabs tablet Take 1 tablet by mouth daily at 12 noon.        Follow-up Information     Steva Ready, DO. Schedule an appointment as soon as possible for a visit in 2 week(s).   Specialty: Obstetrics and Gynecology Why: If you do not already have an appointment with Dr. Connye Burkitt in 2 weeks please call to make an appointment Contact information: [address] [place] [address] [place] [address] [phone]                 Signed: Gerald Leitz 06/02/2021, 10:35 AM

## 2021-06-18 LAB — OB RESULTS CONSOLE HIV ANTIBODY (ROUTINE TESTING): HIV: NONREACTIVE

## 2021-06-27 ENCOUNTER — Encounter: Payer: Self-pay | Admitting: *Deleted

## 2021-06-27 ENCOUNTER — Ambulatory Visit: Payer: 59 | Admitting: *Deleted

## 2021-06-27 ENCOUNTER — Other Ambulatory Visit: Payer: Self-pay | Admitting: *Deleted

## 2021-06-27 ENCOUNTER — Other Ambulatory Visit: Payer: Self-pay

## 2021-06-27 ENCOUNTER — Ambulatory Visit: Payer: 59 | Attending: Obstetrics and Gynecology

## 2021-06-27 VITALS — BP 111/56 | HR 89

## 2021-06-27 DIAGNOSIS — O26879 Cervical shortening, unspecified trimester: Secondary | ICD-10-CM | POA: Insufficient documentation

## 2021-06-27 DIAGNOSIS — Z3A28 28 weeks gestation of pregnancy: Secondary | ICD-10-CM | POA: Diagnosis not present

## 2021-06-27 DIAGNOSIS — O3430 Maternal care for cervical incompetence, unspecified trimester: Secondary | ICD-10-CM

## 2021-06-27 DIAGNOSIS — O3433 Maternal care for cervical incompetence, third trimester: Secondary | ICD-10-CM | POA: Diagnosis not present

## 2021-06-27 DIAGNOSIS — O99213 Obesity complicating pregnancy, third trimester: Secondary | ICD-10-CM

## 2021-07-20 ENCOUNTER — Inpatient Hospital Stay (HOSPITAL_COMMUNITY)
Admission: AD | Admit: 2021-07-20 | Discharge: 2021-07-20 | Disposition: A | Discharge status: Home / Self Care | Payer: 59 | Attending: Obstetrics and Gynecology | Admitting: Obstetrics and Gynecology

## 2021-07-20 ENCOUNTER — Encounter (HOSPITAL_COMMUNITY): Payer: Self-pay | Admitting: Obstetrics and Gynecology

## 2021-07-20 ENCOUNTER — Other Ambulatory Visit: Payer: Self-pay

## 2021-07-20 DIAGNOSIS — X58XXXA Exposure to other specified factors, initial encounter: Secondary | ICD-10-CM | POA: Diagnosis not present

## 2021-07-20 DIAGNOSIS — Z3689 Encounter for other specified antenatal screening: Secondary | ICD-10-CM | POA: Insufficient documentation

## 2021-07-20 DIAGNOSIS — O26879 Cervical shortening, unspecified trimester: Secondary | ICD-10-CM | POA: Diagnosis not present

## 2021-07-20 DIAGNOSIS — Z3A31 31 weeks gestation of pregnancy: Secondary | ICD-10-CM | POA: Insufficient documentation

## 2021-07-20 DIAGNOSIS — S30814A Abrasion of vagina and vulva, initial encounter: Secondary | ICD-10-CM | POA: Diagnosis not present

## 2021-07-20 DIAGNOSIS — O9A213 Injury, poisoning and certain other consequences of external causes complicating pregnancy, third trimester: Secondary | ICD-10-CM | POA: Diagnosis not present

## 2021-07-20 DIAGNOSIS — O26873 Cervical shortening, third trimester: Secondary | ICD-10-CM | POA: Diagnosis not present

## 2021-07-20 DIAGNOSIS — O26853 Spotting complicating pregnancy, third trimester: Secondary | ICD-10-CM | POA: Diagnosis present

## 2021-07-20 LAB — WET PREP, GENITAL
Clue Cells Wet Prep HPF POC: NONE SEEN
Sperm: NONE SEEN
Trich, Wet Prep: NONE SEEN
WBC, Wet Prep HPF POC: >=10 — AB (ref <10)

## 2021-07-20 MED ORDER — TERCONAZOLE 0.4 % VA CREA: 1.0000 | TOPICAL_CREAM | Freq: Every day | VAGINAL | 0 refills | Status: DC

## 2021-07-20 NOTE — MAU Note (Signed)
PT SAYS AT 430PM- WENT TO B-ROOM- WIPED - SAW LIGHT PINK ?THEN TURNED TO RED BLOOD ?CALLED [facility]- DR Richardson Dopp TOLD TO COME HERE ?AT 24 WEEKS- ADMITTED - BC VE 1-2 CM  ?DENIES  UC ?LAST SEX- 24 WEEKS  ?IN TRIAGE - NO PAD- NO BLOOD - PT SAYS ONLY WHEN SHE WIPES  ?

## 2021-07-20 NOTE — MAU Provider Note (Signed)
?History  ?  ? ?CSN: [id number] ? ?Arrival date and time: 07/20/21 1914 ? ? Event Date/Time  ? First Provider Initiated Contact with Patient 07/20/21 2003   ?  ? ?Chief Complaint  ?Patient presents with  ? Vaginal Bleeding  ? ?Kaylee Armstrong is a 31 y.o. G2P1001 at [redacted]w[redacted]d who receives care at [facility].  She presents  today for Vaginal Bleeding. She reports she noticed bleeding, with wiping, and was initially light pink but progressed to bright red.  She endorses continued bleeding, with wiping, upon arrival to MAU. She reports she noticed around 1630 and had no cramping.  She denies discharge prior to the bleeding.  She reports no sexual activity since 24 weeks d/t short cervix.   ? ? ?OB History   ? ? Gravida  ?2  ? Para  ?1  ? Term  ?1  ? Preterm  ?   ? AB  ?   ? Living  ?1  ?  ? ? SAB  ?   ? IAB  ?   ? Ectopic  ?   ? Multiple  ?0  ? Live Births  ?1  ?   ?  ?  ? ? ?Past Medical History:  ?Diagnosis Date  ? Joint pain   ? Medical history non-contributory   ? ? ?Past Surgical History:  ?Procedure Laterality Date  ? NO PAST SURGERIES    ? ? ?Family History  ?Problem Relation Age of Onset  ? Asthma Father   ? Diabetes Maternal Grandmother   ? Diabetes Paternal Grandmother   ? Cancer Paternal Grandfather   ? ? ?Social History  ? ?Tobacco Use  ? Smoking status: Former  ? Smokeless tobacco: Never  ?Vaping Use  ? Vaping Use: Never used  ?Substance Use Topics  ? Alcohol use: Yes  ?  Comment: NONE    DUTING  PREG  ? Drug use: No  ? ? ?Allergies: No Known Allergies ? ?Medications Prior to Admission  ?Medication Sig Dispense Refill Last Dose  ? acetaminophen (TYLENOL) 325 MG tablet Take 325 mg by mouth every 6 (six) hours as needed for mild pain.    Past Week  ? butalbital-acetaminophen-caffeine (FIORICET) 50-325-40 MG tablet Take by mouth 2 (two) times daily as needed for headache.   Past Month  ? loratadine (CLARITIN) 10 MG tablet Take 10 mg by mouth daily as needed for allergies.   Past Week  ? Prenatal Vit-Fe Fumarate-FA  (PRENATAL MULTIVITAMIN) TABS tablet Take 1 tablet by mouth daily at 12 noon.   07/20/2021  ? ? ?Review of Systems  ?Gastrointestinal:  Positive for constipation (BM today hard to pass). Negative for abdominal pain, diarrhea, nausea and vomiting.  ?Genitourinary:  Positive for vaginal bleeding. Negative for difficulty urinating, dysuria and vaginal discharge.  ?Musculoskeletal:  Negative for back pain.  ?Physical Exam  ? ?Blood pressure 117/75, pulse (!) 103, temperature 98.8 ?F (37.1 ?C), temperature source Oral, resp. rate 20, height 5\' 7"  (1.702 m), weight 108 kg, last menstrual period 12/10/2020, SpO2 99 %, unknown if currently breastfeeding. ? ?Physical Exam ?Exam conducted with a chaperone present.  ?Constitutional:   ?   Appearance: Normal appearance.  ?HENT:  ?   Head: Normocephalic and atraumatic.  ?Eyes:  ?   Conjunctiva/sclera: Conjunctivae normal.  ?Cardiovascular:  ?   Rate and Rhythm: Normal rate.  ?Pulmonary:  ?   Effort: Pulmonary effort is normal. No respiratory distress.  ?Genitourinary: ?   Labia:     ?  Left: Injury present.   ?   Vagina: Vaginal discharge present.  ?   Cervix: Discharge present. No friability or cervical bleeding.  ? ? ?   Comments: Speculum Exam: ?-Normal External Genitalia: Labia majora with posterior abrasion noted ~ 1x1 w/o active bleeding.  Several white plaques noted on labias bilaterally.  Wet prep collected.  ?-Vaginal Vault: Pink mucosa with good rugae. Copious amt thick white discharge -wet prep recollected ?-Cervix:Pink, no lesions, cysts, or polyps.  Appears closed. No active bleeding from os-GC/CT collected ?-Bimanual Exam: Dilation: 1.5 ?Effacement (%): 50 ?Station: Ballotable ?Exam by:: Gerrit Heck, CNM ?Musculoskeletal:     ?   General: Normal range of motion.  ?   Cervical back: Normal range of motion.  ?Skin: ?   General: Skin is dry.  ?Neurological:  ?   Mental Status: She is alert and oriented to person, place, and time.  ?Psychiatric:     ?   Mood and  Affect: Mood normal.     ?   Behavior: Behavior normal.     ?   Thought Content: Thought content normal.  ? ? ?Fetal Assessment ?155 bpm, Mod Var, -Decels, +15x15 Accels ?Toco: Irritability with Irregular Ctx ? ?MAU Course  ? ?Results for orders placed or performed during the hospital encounter of 07/20/21 (from the past 24 hour(s))  ?Wet prep, genital     Status: Abnormal  ? Collection Time: 07/20/21  8:14 PM  ? Specimen: PATH Cytology Cervicovaginal Ancillary Only  ?Result Value Ref Range  ? Yeast Wet Prep HPF POC PRESENT (A) NONE SEEN  ? Trich, Wet Prep NONE SEEN NONE SEEN  ? Clue Cells Wet Prep HPF POC NONE SEEN NONE SEEN  ? WBC, Wet Prep HPF POC >=10 (A) <10  ? Sperm NONE SEEN   ? ?No results found. ? ?MDM ?PE with Wet Prep, GC/CT ?Prescription ?Assessment and Plan  ?31 year old G2P1001  ?SIUP at 31.5 weeks ?Cat I FT ?Vaginal Abrasion ?Yeast Infection ? ?-POC Reviewed.  ?-Exam performed and findings discussed. ?-Cultures collected and pending. ?-Informed that findings are suggestive of yeast infection and will treat accordingly. ?-Reassured that bleeding is from abrasion noted at introitus.  Reviewed potential causes including wiping to hard or irritation from yeast. ?-Informed that if other findings return positive, will send notification and relevant treatment via mychart. ?-Patient without questions and expresses gratitude. ?-Follow up as scheduled. ?-NST Reactive. ?-Encouraged to call primary office or return to MAU if symptoms worsen or with the onset of new symptoms. ?-Discharged to home in stable condition. ? ? ?Cherre Robins MSN, CNM ?07/20/2021, 8:04 PM  ? ? ?

## 2021-07-23 LAB — GC/CHLAMYDIA PROBE AMP (~~LOC~~) NOT AT ARMC
Chlamydia: NEGATIVE
Comment: NEGATIVE
Comment: NORMAL
Neisseria Gonorrhea: NEGATIVE

## 2021-07-26 ENCOUNTER — Other Ambulatory Visit: Payer: Self-pay | Admitting: *Deleted

## 2021-07-26 ENCOUNTER — Encounter: Payer: Self-pay | Admitting: *Deleted

## 2021-07-26 ENCOUNTER — Ambulatory Visit: Payer: 59 | Attending: Maternal & Fetal Medicine

## 2021-07-26 ENCOUNTER — Ambulatory Visit: Payer: 59 | Admitting: *Deleted

## 2021-07-26 VITALS — BP 113/68 | HR 91

## 2021-07-26 DIAGNOSIS — O26879 Cervical shortening, unspecified trimester: Secondary | ICD-10-CM | POA: Insufficient documentation

## 2021-07-26 DIAGNOSIS — O99213 Obesity complicating pregnancy, third trimester: Secondary | ICD-10-CM | POA: Insufficient documentation

## 2021-07-26 DIAGNOSIS — O3433 Maternal care for cervical incompetence, third trimester: Secondary | ICD-10-CM | POA: Diagnosis not present

## 2021-07-26 DIAGNOSIS — O280 Abnormal hematological finding on antenatal screening of mother: Secondary | ICD-10-CM | POA: Diagnosis not present

## 2021-07-26 DIAGNOSIS — O3430 Maternal care for cervical incompetence, unspecified trimester: Secondary | ICD-10-CM

## 2021-07-26 DIAGNOSIS — Z3A32 32 weeks gestation of pregnancy: Secondary | ICD-10-CM

## 2021-08-23 ENCOUNTER — Ambulatory Visit: Payer: 59 | Attending: Obstetrics and Gynecology

## 2021-08-23 ENCOUNTER — Encounter: Payer: Self-pay | Admitting: *Deleted

## 2021-08-23 ENCOUNTER — Ambulatory Visit: Payer: 59 | Admitting: *Deleted

## 2021-08-23 VITALS — BP 113/72 | HR 92

## 2021-08-23 DIAGNOSIS — Z362 Encounter for other antenatal screening follow-up: Secondary | ICD-10-CM | POA: Diagnosis not present

## 2021-08-23 DIAGNOSIS — O26879 Cervical shortening, unspecified trimester: Secondary | ICD-10-CM

## 2021-08-23 DIAGNOSIS — O3430 Maternal care for cervical incompetence, unspecified trimester: Secondary | ICD-10-CM | POA: Diagnosis present

## 2021-08-23 DIAGNOSIS — O3433 Maternal care for cervical incompetence, third trimester: Secondary | ICD-10-CM | POA: Diagnosis not present

## 2021-08-23 DIAGNOSIS — Z3A36 36 weeks gestation of pregnancy: Secondary | ICD-10-CM | POA: Diagnosis not present

## 2021-08-31 ENCOUNTER — Other Ambulatory Visit: Payer: Self-pay

## 2021-08-31 ENCOUNTER — Encounter (HOSPITAL_COMMUNITY): Payer: Self-pay | Admitting: Obstetrics and Gynecology

## 2021-08-31 ENCOUNTER — Inpatient Hospital Stay (HOSPITAL_COMMUNITY)
Admission: AD | Admit: 2021-08-31 | Discharge: 2021-09-02 | DRG: 806 | Disposition: A | Discharge status: Home / Self Care | Payer: 59 | Attending: Obstetrics and Gynecology | Admitting: Obstetrics and Gynecology

## 2021-08-31 DIAGNOSIS — Z87891 Personal history of nicotine dependence: Secondary | ICD-10-CM

## 2021-08-31 DIAGNOSIS — O26893 Other specified pregnancy related conditions, third trimester: Secondary | ICD-10-CM | POA: Diagnosis present

## 2021-08-31 DIAGNOSIS — O99824 Streptococcus B carrier state complicating childbirth: Secondary | ICD-10-CM | POA: Diagnosis present

## 2021-08-31 DIAGNOSIS — Z3A37 37 weeks gestation of pregnancy: Secondary | ICD-10-CM | POA: Diagnosis not present

## 2021-08-31 LAB — CBC
HCT: 32.8 % — ABNORMAL LOW (ref 36.0–46.0)
Hemoglobin: 11.2 g/dL — ABNORMAL LOW (ref 12.0–15.0)
MCH: 31.3 pg (ref 26.0–34.0)
MCHC: 34.1 g/dL (ref 30.0–36.0)
MCV: 91.6 fL (ref 80.0–100.0)
Platelets: 274 10*3/uL (ref 150–400)
RBC: 3.58 MIL/uL — ABNORMAL LOW (ref 3.87–5.11)
RDW: 13.3 % (ref 11.5–15.5)
WBC: 9.1 10*3/uL (ref 4.0–10.5)
nRBC: 0 % (ref 0.0–0.2)

## 2021-08-31 LAB — TYPE AND SCREEN
ABO/RH(D): A POS
Antibody Screen: NEGATIVE

## 2021-08-31 LAB — RPR: RPR Ser Ql: NONREACTIVE

## 2021-08-31 MED ORDER — SOD CITRATE-CITRIC ACID 500-334 MG/5ML PO SOLN: 30.0000 mL | ORAL | Status: DC | PRN

## 2021-08-31 MED ORDER — DIBUCAINE (PERIANAL) 1 % EX OINT: 1.0000 "application " | TOPICAL_OINTMENT | CUTANEOUS | Status: DC | PRN

## 2021-08-31 MED ORDER — METHYLERGONOVINE MALEATE 0.2 MG/ML IJ SOLN: 0.2000 mg | INTRAMUSCULAR | Status: DC | PRN

## 2021-08-31 MED ORDER — BUTALBITAL-APAP-CAFFEINE 50-325-40 MG PO TABS: 2.0000 | ORAL_TABLET | Freq: Four times a day (QID) | ORAL | Status: DC | PRN

## 2021-08-31 MED ORDER — BENZOCAINE-MENTHOL 20-0.5 % EX AERO
1.0000 "application " | INHALATION_SPRAY | CUTANEOUS | Status: DC | PRN
  Administered 2021-08-31: 1 via TOPICAL
  Filled 2021-08-31: qty 56

## 2021-08-31 MED ORDER — TRANEXAMIC ACID-NACL 1000-0.7 MG/100ML-% IV SOLN
INTRAVENOUS | Status: AC
  Administered 2021-08-31: 1000 mg
  Filled 2021-08-31: qty 100

## 2021-08-31 MED ORDER — SIMETHICONE 80 MG PO CHEW: 80.0000 mg | CHEWABLE_TABLET | ORAL | Status: DC | PRN

## 2021-08-31 MED ORDER — ZOLPIDEM TARTRATE 5 MG PO TABS: 5.0000 mg | ORAL_TABLET | Freq: Every evening | ORAL | Status: DC | PRN

## 2021-08-31 MED ORDER — OXYCODONE-ACETAMINOPHEN 5-325 MG PO TABS: 2.0000 | ORAL_TABLET | ORAL | Status: DC | PRN

## 2021-08-31 MED ORDER — ONDANSETRON HCL 4 MG/2ML IJ SOLN: 4.0000 mg | INTRAMUSCULAR | Status: DC | PRN

## 2021-08-31 MED ORDER — LACTATED RINGERS IV SOLN: 500.0000 mL | INTRAVENOUS | Status: DC | PRN

## 2021-08-31 MED ORDER — ONDANSETRON HCL 4 MG/2ML IJ SOLN: 4.0000 mg | Freq: Four times a day (QID) | INTRAMUSCULAR | Status: DC | PRN

## 2021-08-31 MED ORDER — OXYCODONE-ACETAMINOPHEN 5-325 MG PO TABS: 1.0000 | ORAL_TABLET | ORAL | Status: DC | PRN

## 2021-08-31 MED ORDER — OXYTOCIN BOLUS FROM INFUSION: 333.0000 mL | Freq: Once | INTRAVENOUS | Status: AC

## 2021-08-31 MED ORDER — WITCH HAZEL-GLYCERIN EX PADS: 1.0000 "application " | MEDICATED_PAD | CUTANEOUS | Status: DC | PRN

## 2021-08-31 MED ORDER — MISOPROSTOL 200 MCG PO TABS: 1000.0000 ug | ORAL_TABLET | Freq: Once | ORAL | Status: DC

## 2021-08-31 MED ORDER — ONDANSETRON HCL 4 MG PO TABS: 4.0000 mg | ORAL_TABLET | ORAL | Status: DC | PRN

## 2021-08-31 MED ORDER — FERROUS SULFATE 325 (65 FE) MG PO TABS
325.0000 mg | ORAL_TABLET | Freq: Two times a day (BID) | ORAL | Status: DC
  Administered 2021-08-31 – 2021-09-01 (×3): 325 mg via ORAL
  Filled 2021-08-31 (×3): qty 1

## 2021-08-31 MED ORDER — TRANEXAMIC ACID-NACL 1000-0.7 MG/100ML-% IV SOLN: 1000.0000 mg | INTRAVENOUS | Status: DC

## 2021-08-31 MED ORDER — LORATADINE 10 MG PO TABS: 10.0000 mg | ORAL_TABLET | Freq: Every day | ORAL | Status: DC | PRN

## 2021-08-31 MED ORDER — ACETAMINOPHEN 325 MG PO TABS
650.0000 mg | ORAL_TABLET | Freq: Four times a day (QID) | ORAL | Status: DC | PRN
  Administered 2021-08-31: 650 mg via ORAL

## 2021-08-31 MED ORDER — OXYTOCIN-SODIUM CHLORIDE 30-0.9 UT/500ML-% IV SOLN: 2.5000 [IU]/h | INTRAVENOUS | Status: DC

## 2021-08-31 MED ORDER — SODIUM CHLORIDE 0.9 % IV SOLN
2.0000 g | Freq: Once | INTRAVENOUS | Status: AC
  Administered 2021-08-31: 2 g via INTRAVENOUS
  Filled 2021-08-31: qty 2000

## 2021-08-31 MED ORDER — LACTATED RINGERS IV SOLN: INTRAVENOUS | Status: DC

## 2021-08-31 MED ORDER — SENNOSIDES-DOCUSATE SODIUM 8.6-50 MG PO TABS
2.0000 | ORAL_TABLET | Freq: Every day | ORAL | Status: DC
  Administered 2021-09-01 – 2021-09-02 (×2): 2 via ORAL
  Filled 2021-08-31 (×2): qty 2

## 2021-08-31 MED ORDER — COCONUT OIL OIL: 1.0000 "application " | TOPICAL_OIL | Status: DC | PRN

## 2021-08-31 MED ORDER — DIPHENHYDRAMINE HCL 25 MG PO CAPS: 25.0000 mg | ORAL_CAPSULE | Freq: Four times a day (QID) | ORAL | Status: DC | PRN

## 2021-08-31 MED ORDER — METHYLERGONOVINE MALEATE 0.2 MG PO TABS: 0.2000 mg | ORAL_TABLET | ORAL | Status: DC | PRN

## 2021-08-31 MED ORDER — SODIUM CHLORIDE 0.9 % IV SOLN: 1.0000 g | INTRAVENOUS | Status: DC

## 2021-08-31 MED ORDER — METHYLERGONOVINE MALEATE 0.2 MG/ML IJ SOLN
INTRAMUSCULAR | Status: AC
  Administered 2021-08-31: 0.2 mg
  Filled 2021-08-31: qty 1

## 2021-08-31 MED ORDER — MISOPROSTOL 200 MCG PO TABS
ORAL_TABLET | ORAL | Status: AC
Start: 2021-08-31 — End: 2021-08-31
  Administered 2021-08-31: 1000 ug
  Filled 2021-08-31: qty 5

## 2021-08-31 MED ORDER — IBUPROFEN 600 MG PO TABS
600.0000 mg | ORAL_TABLET | Freq: Four times a day (QID) | ORAL | Status: DC
  Administered 2021-08-31 – 2021-09-02 (×9): 600 mg via ORAL
  Filled 2021-08-31 (×9): qty 1

## 2021-08-31 MED ORDER — LIDOCAINE HCL (PF) 1 % IJ SOLN: 30.0000 mL | INTRAMUSCULAR | Status: DC | PRN

## 2021-08-31 MED ORDER — ACETAMINOPHEN 325 MG PO TABS
ORAL_TABLET | ORAL | Status: AC
  Filled 2021-08-31: qty 2

## 2021-08-31 MED ORDER — OXYTOCIN-SODIUM CHLORIDE 30-0.9 UT/500ML-% IV SOLN
INTRAVENOUS | Status: AC
  Administered 2021-08-31: 333 mL via INTRAVENOUS
  Filled 2021-08-31: qty 500

## 2021-08-31 MED ORDER — PRENATAL MULTIVITAMIN CH
1.0000 | ORAL_TABLET | Freq: Every day | ORAL | Status: DC
  Administered 2021-08-31 – 2021-09-02 (×3): 1 via ORAL
  Filled 2021-08-31 (×3): qty 1

## 2021-08-31 MED ORDER — ACETAMINOPHEN 325 MG PO TABS: 650.0000 mg | ORAL_TABLET | ORAL | Status: DC | PRN

## 2021-08-31 NOTE — Lactation Note (Signed)
This note was copied from a baby's chart. ?Lactation Consultation Note ? ?Patient Name: Kaylee Armstrong ?Today's Date: 08/31/2021 ?Reason for consult: L&D Initial assessment;Early term 37-38.6wks ?Age:32 hours ? ?[redacted]w[redacted]d.  Baby cueing.  Assisted with latching off and on.  Baby eager to feed. Mother had difficulty latching her son and primarily pumped and bottle fed.  ?Lactation to follow up on MBU.  ? ?Maternal Data ?Does the patient have breastfeeding experience prior to this delivery?: Yes ? ?Feeding ?Mother's Current Feeding Choice: Breast Milk ? ?LATCH Score ?Latch: Repeated attempts needed to sustain latch, nipple held in mouth throughout feeding, stimulation needed to elicit sucking reflex. ? ?Audible Swallowing: A few with stimulation ? ?Type of Nipple: Flat ? ?Comfort (Breast/Nipple): Soft / non-tender ? ?Hold (Positioning): Assistance needed to correctly position infant at breast and maintain latch. ? ?LATCH Score: 6 ? ? ?Lactation Tools Discussed/Used ?  ? ?Interventions ?Interventions: Assisted with latch;Skin to skin;Education ? ?Discharge ?  ? ?Consult Status ?Consult Status: Follow-up from L&D ? ? ? ?Dahlia Byes Boschen ?08/31/2021, 8:48 AM ? ? ? ?

## 2021-08-31 NOTE — MAU Note (Signed)
Patient arrived to MAU with C/O of contractions and LOF. Patient reports LOF at 0430 this morning. Patient is rating her pain at a 8/10. Endorses +FM. ?

## 2021-08-31 NOTE — Lactation Note (Signed)
This note was copied from a baby's chart. ?Lactation Consultation Note ? ?Patient Name: Kaylee Armstrong ?Today's Date: 08/31/2021 ?Reason for consult: Initial assessment;Early term 37-38.6wks ?Age:32 hours ? ? ?P2 mother whose infant is now 48 hours old.  This is an early term infant at 37+5 weeks.  Mother's current feeding preference is breast. ? ?Baby "Kaylee Armstrong" was asleep in the bassinet when I arrived.  Reviewed breast feeding basics with mother.  Taught hand expression; no drops noted at this time.  Demonstrated finger/spoon feeding and encouraged mother to continue practicing breast massage and hand expression.  Encouraged to feed with cues and to call for latch assistance as needed. ? ?Manual pump provided with instructions for use.  Suggested mother may wish to pre-pump prior to latching to help with nipple eversion.  Mother willing.  She informed me that this baby has already latched so much better than her son; mother excited about breast feding.  Mother had a family visitor at the end of my visit; father to return soon.   ? ? ?Maternal Data ?Has patient been taught Hand Expression?: Yes ?Does the patient have breastfeeding experience prior to this delivery?: No (Mother pumped and bottle fed her first child for 8-9 months) ? ?Feeding ?Mother's Current Feeding Choice: Breast Milk ? ?LATCH Score ?  ? ?  ? ?  ? ?  ? ?  ? ?  ? ? ?Lactation Tools Discussed/Used ?Tools: Pump ?Breast pump type: Manual ?Pump Education: Setup, frequency, and cleaning;Milk Storage ?Reason for Pumping: Nipple eversion ?Pumping frequency: Prior to feeding and prn ? ?Interventions ?Interventions: Breast feeding basics reviewed;Education;LC Services brochure ? ?Discharge ?Pump: Personal ?[place] Program: No ? ?Consult Status ?Consult Status: Follow-up ?Date: 09/01/21 ?Follow-up type: In-patient ? ? ? ?Kaylee Armstrong Kaylee Armstrong ?08/31/2021, 3:22 PM ? ? ? ?

## 2021-08-31 NOTE — Lactation Note (Signed)
This note was copied from a baby's chart. ?Lactation Consultation Note ?RN asked LC to see mom d/t mom wanting to pump. RN also stated mom needed a larger NS that she has a #20 and it isn't big enough.  ?LC assessed and mom did need a larger NS. #24 given and demonstrated application. ?Mom exclusively pumped for her son for 8-9 months and had to supplement w/formula. ?Mom shown how to use DEBP & how to disassemble, clean, & reassemble parts. Mom knows to pump q3h for 15-20 min.  ?LC used #27 flanges for pumping. Mom has edema to breast. Mom pumped after feeding w/no colostrum noted. ?Mom encouraged to feed baby 8-12 times/24 hours and with feeding cues.   ?LC gave mom supplemental information sheet for supplement w/BF and just formula feeding. ?Baby latched w/NS and it was uncomfortable d/t pulling. Baby suckling aggressively. No colostrum noted in NS. ?Formula given after feeding. Pace feeding taught. ?Shells given. Encouraged mom to wear in am w/bra to help w/edema. ?Reported to RN. ?Mom will call for assistance as needed. ? ?Patient Name: Girl Vikki Gains ?Today's Date: 08/31/2021 ?Reason for consult: Mother's request;Early term 37-38.6wks ?Age:10 hours ? ?Maternal Data ?Has patient been taught Hand Expression?: Yes ?Does the patient have breastfeeding experience prior to this delivery?: Yes ?How long did the patient breastfeed?: 8-9 months ? ?Feeding ?Mother's Current Feeding Choice: Breast Milk and Formula ?Nipple Type: Slow - flow ? ?LATCH Score ?Latch: Repeated attempts needed to sustain latch, nipple held in mouth throughout feeding, stimulation needed to elicit sucking reflex. ? ?Audible Swallowing: None ? ?Type of Nipple: Flat ? ?Comfort (Breast/Nipple): Filling, red/small blisters or bruises, mild/mod discomfort (edema) ? ?Hold (Positioning): Assistance needed to correctly position infant at breast and maintain latch. ? ?LATCH Score: 4 ? ? ?Lactation Tools Discussed/Used ?Tools: Shells;Pump;Flanges;Nipple  Dorris Carnes ?Nipple shield size: 24 ?Flange Size: 27 ?Breast pump type: Double-Electric Breast Pump ?Pump Education: Setup, frequency, and cleaning;Milk Storage ?Reason for Pumping: flat/edema ?Pumping frequency: q3h ?Pumped volume: 0 mL ? ?Interventions ?Interventions: Breast feeding basics reviewed;Assisted with latch;Skin to skin;Breast massage;Hand express;Pre-pump if needed;Breast compression;Adjust position;Support pillows;Position options;Shells;Hand pump;DEBP;Pace feeding ? ?Discharge ?  ? ?Consult Status ?Consult Status: Follow-up ?Date: 09/01/21 ?Follow-up type: In-patient ? ? ? ?Charyl Dancer ?08/31/2021, 11:19 PM ? ? ? ?

## 2021-08-31 NOTE — H&P (Addendum)
OB ADMISSION/ HISTORY & PHYSICAL: ? ?Admission Date: 08/31/2021  6:38 AM  ?Admit Diagnosis: Normal labor ? ?Kaylee Armstrong is a 31 y.o. female G2P1001 [redacted]w[redacted]d presenting for labor eval and found to be complete with bulging membranes. Labor started @ 0400 at home. Pregnancy complicated by threatened preterm labor,shortening cervix, treated with antenatal steroids and bedrest. ? ?History of current pregnancy: ?G2P1001   ?Primary OB Provider: Dr. Richardson Dopp ?Patient entered care with [facility]  at 9+2 wks.   ?EDC by [place] @ 9+2 and congruent w/ LMP.   ?Anatomy scan:  18+4 wks, limited due to maternal habitus w/ posterior placenta.   ? ?Significant prenatal events:  ?Patient Active Problem List  ? Diagnosis Date Noted  ? Atopic dermatitis 05/30/2021  ? Hyperpigmentation 05/30/2021  ? Seborrheic dermatitis 05/30/2021  ? Cervical shortening affecting pregnancy 05/30/2021  ? Mixed connective tissue disease (HCC) 05/30/2021  ? Obesity (BMI 30.0-34.9) 05/30/2021  ? Frequent headaches 05/30/2021  ? Cervical shortening 05/30/2021  ? Indication for care in labor or delivery 11/15/2014  ? ? ?Prenatal Labs: ?ABO, Rh: --/--/A POS (05/05 0700) ?Antibody: NEG (05/05 0700) ?Rubella:   immune ?RPR:   NR ?HBsAg: Negative (10/18 0000) NR ?HIV: Non-reactive (02/20 0000) NR ?GTT: normal 1 hr ?GBS:   positive ?GC/CHL: neg.neg ?Tdap/influenza vaccines: tdap current/ declined flu ? ? ?OB History  ?Gravida Para Term Preterm AB Living  ?2 1 1     1   ?SAB IAB Ectopic Multiple Live Births  ?      0 1  ?  ?# Outcome Date GA Lbr Len/2nd Weight Sex Delivery Anes PTL Lv  ?2 Current           ?1 Term 11/15/14 [redacted]w[redacted]d 22:19 / 05:04 3025 g M Vag-Spont EPI  LIV  ?   Birth Comments: Breast milk and supplementing with Similac Advance ? ?Hgb, Normal, FA ?Newborn Screen Barcode: [redacted]w[redacted]d ?Date collected: 11/17/2014  ? ? ?Medical / Surgical History: ?Past medical history:  ?Past Medical History:  ?Diagnosis Date  ? Joint pain   ? Medical history non-contributory   ?  ?Past  surgical history:  ?Past Surgical History:  ?Procedure Laterality Date  ? NO PAST SURGERIES    ? ?Family History:  ?Family History  ?Problem Relation Age of Onset  ? Asthma Father   ? Diabetes Maternal Grandmother   ? Diabetes Paternal Grandmother   ? Cancer Paternal Grandfather   ?  ?Social History:  reports that she has quit smoking. She has never used smokeless tobacco. She reports current alcohol use. She reports that she does not use drugs. ? ?Allergies: ?Patient has no known allergies. ?  ?Current Medications at time of admission:  ?Prior to Admission medications   ?Medication Sig Start Date End Date Taking? Authorizing Provider  ?Prenatal Vit-Fe Fumarate-FA (PRENATAL MULTIVITAMIN) TABS tablet Take 1 tablet by mouth daily at 12 noon.   Yes [provider]  ?acetaminophen (TYLENOL) 325 MG tablet Take 325 mg by mouth every 6 (six) hours as needed for mild pain.  ?Patient not taking: Reported on 07/26/2021    [provider]  ?butalbital-acetaminophen-caffeine (FIORICET) 50-325-40 MG tablet Take by mouth 2 (two) times daily as needed for headache.    [provider]  ?loratadine (CLARITIN) 10 MG tablet Take 10 mg by mouth daily as needed for allergies. ?Patient not taking: Reported on 07/26/2021    [provider]  ? ? ?Review of Systems: ?Constitutional: Negative   ?HENT: Negative   ?Eyes: Negative   ?  Respiratory: Negative   ?Cardiovascular: Negative   ?Gastrointestinal: Negative  ?Genitourinary: neg for bloody show, neg for LOF   ?Musculoskeletal: Negative   ?Skin: Negative   ?Neurological: Negative   ?Endo/Heme/Allergies: Negative   ?Psychiatric/Behavioral: Negative  ? ? ?Physical Exam: ?VS: Blood pressure 123/70, pulse (!) 108, last menstrual period 12/10/2020, unknown if currently breastfeeding. ?AAO x3, no signs of distress ?Cardiovascular: RRR ?Respiratory: Lung fields clear to ausculation ?GU/GI: Abdomen gravid, non-tender, non-distended, active FM ?Extremities: no edema,  negative for pain, tenderness, and cords ? ?Cervical exam:Dilation: 10 ?Effacement (%): 100 ?Station: -2 ?Exam by:: Rhea Pink CNM ?FHR: baseline rate 135 / variability moderate / accelerations absent / early decelerations ?TOCO: 2 min ? ? ?Prenatal Transfer Tool  ?Maternal Diabetes: No ?Genetic Screening: Normal ?Maternal Ultrasounds/Referrals: Normal ?Fetal Ultrasounds or other Referrals:  Fetal echo, Other: Difficulty w/ cardiac views on fetal echo, but no overt abnormalities ?Maternal Substance Abuse:  No ?Significant Maternal Medications:  Meds include: Other:  Betamethasone complete on 2/3 and vaginal progesterone ?Significant Maternal Lab Results: Group B Strep positive ? ? ? ?Assessment: ?31 y.o. G2P1001 [redacted]w[redacted]d ?Precipitous labor and birth ? ?Transitional stage of labor ?FHR category 1 ?GBS positive ?Pain management plan: unmedicated ? ? ?Plan:  ?Admit to L&D ?Routine admission orders ?Ampicillin 2G IV given inadequate GBS prophylaxis ?Delivery imminent ? ?Dr Richardson Dopp notified complete cervix on arrival. MD en route. ? ?Roma Schanz MSN, CNM ?08/31/2021 8:04 AM ? ?

## 2021-09-01 LAB — CBC
HCT: 26.9 % — ABNORMAL LOW (ref 36.0–46.0)
Hemoglobin: 9.2 g/dL — ABNORMAL LOW (ref 12.0–15.0)
MCH: 31.2 pg (ref 26.0–34.0)
MCHC: 34.2 g/dL (ref 30.0–36.0)
MCV: 91.2 fL (ref 80.0–100.0)
Platelets: 247 10*3/uL (ref 150–400)
RBC: 2.95 MIL/uL — ABNORMAL LOW (ref 3.87–5.11)
RDW: 13.4 % (ref 11.5–15.5)
WBC: 8.1 10*3/uL (ref 4.0–10.5)
nRBC: 0 % (ref 0.0–0.2)

## 2021-09-01 MED ORDER — IBUPROFEN 600 MG PO TABS
600.0000 mg | ORAL_TABLET | Freq: Four times a day (QID) | ORAL | 0 refills | Status: AC | PRN
Start: 2021-09-01 — End: ?

## 2021-09-01 NOTE — Progress Notes (Signed)
Post Partum Day 1 ?Subjective: ?no complaints, up ad lib, voiding, and tolerating PO ? ?Objective: ?Blood pressure 100/72, pulse 88, temperature 97.7 ?F (36.5 ?C), temperature source Oral, resp. rate 17, height 5\' 7"  (1.702 m), weight 109.8 kg, last menstrual period 12/10/2020, SpO2 100 %, unknown if currently breastfeeding. ? ?Physical Exam:  ?General: alert, cooperative, and no distress ?Lochia: appropriate ?Uterine Fundus: firm ?Incision: NA ?DVT Evaluation: No evidence of DVT seen on physical exam. ? ?Recent Labs  ?  08/31/21 ?0700 09/01/21 ?0457  ?HGB 11.2* 9.2*  ?HCT 32.8* 26.9*  ? ? ?Assessment/Plan: ?Plan for discharge tomorrow, Breastfeeding, and Lactation consult ?GBS positive - pt delivered precipitously and did not receive antibiotics... Newborn being watched for any signs of sepsis.  ? ? ? LOS: 1 day  ? ?11/01/21 ?09/01/2021, 12:08 PM  ? ? ?

## 2021-09-02 NOTE — Lactation Note (Signed)
This note was copied from a baby's chart. ?Lactation Consultation Note ? ?Patient Name: Girl Fleur Audino ?Today's Date: 09/02/2021 ?Reason for consult: Follow-up assessment ?Age:32 hours ? ? ?P2 mother whose infant is now 31 hours old.  This is an early term infant at 37+5 weeks.  Mother's current feeding preference is breast/formula. ? ?Baby "Sid Falcon" was receiving a bottle when I arrived; has been primarily formula feeding.  She has consumed between 35-58 mls/feeding.  Mother stated she was going to continue to breast feed and pump.  Encouraged continued hand expression and to pump consistently every three hours after feedings.   ? ?Family has our OP phone number for any concerns after discharge.  Visitors present. ? ? ?Maternal Data ?  ? ?Feeding ?Nipple Type: Slow - flow ? ?LATCH Score ?  ? ?  ? ?  ? ?  ? ?  ? ?  ? ? ?Lactation Tools Discussed/Used ?  ? ?Interventions ?Interventions: Education ? ?Discharge ?Discharge Education: Engorgement and breast care ? ?Consult Status ?Consult Status: Complete ?Date: 09/02/21 ?Follow-up type: Call as needed ? ? ? ?Jhoana Upham R Marvel Mcphillips ?09/02/2021, 1:03 PM ? ? ? ?

## 2021-09-02 NOTE — Discharge Summary (Signed)
? ?  Postpartum Discharge Summary ? ?Date of Service updated 09/02/2021 ? ?   ?Patient Name: Kaylee Armstrong ?DOB: [birth year] ?MRN: [id number] ? ?Date of admission: 08/31/2021 ?Delivery date:08/31/2021  ?Delivering provider: Burman Foster B  ?Date of discharge: 09/02/2021 ? ?Admitting diagnosis: Indication for care in labor or delivery [O75.9] ?Intrauterine pregnancy: [redacted]w[redacted]d     ?Secondary diagnosis:  Principal Problem: ?  Indication for care in labor or delivery ?Active Problems: ?  PPH (postpartum hemorrhage) ? ?Additional problems: None    ?Discharge diagnosis: Term Pregnancy Delivered                                              ?Post partum procedures: None ?Augmentation: N/A ?Complications: None ? ?Hospital course: Onset of Labor With Vaginal Delivery      ?31 y.o. yo G2P2002 at [redacted]w[redacted]d was admitted in Active Labor on 08/31/2021. Patient had an uncomplicated labor course as follows:  ?Membrane Rupture Time/Date: 7:15 AM ,08/31/2021   ?Delivery Method:Vaginal, Spontaneous  ?Episiotomy: None  ?Lacerations:  1st degree  ?Patient had an uncomplicated postpartum course.  She is ambulating, tolerating a regular diet, passing flatus, and urinating well. Patient is discharged home in stable condition on 09/02/21. ? ?Newborn Data: ?Birth date:08/31/2021  ?Birth time:7:46 AM  ?Gender:Female  ?Living status:Living  ?Apgars:9 ,9  ?Weight:3250 g  ? ?Magnesium Sulfate received: No ?BMZ received: No ?Rhophylac:N/A ?MMR:N/A ?T-DaP:Given prenatally ?Flu: N/A ?Transfusion:No ? ?Physical exam  ?Vitals:  ? 09/01/21 0620 09/01/21 1500 09/01/21 2211 09/02/21 0500  ?BP: 100/72 111/63 108/68 111/70  ?Pulse: 88 87 88 81  ?Resp: $Remov'17 18 16 16  'kwaQfc$ ?Temp: 97.7 ?F (36.5 ?C) (!) 97.5 ?F (36.4 ?C) 97.7 ?F (36.5 ?C) 97.8 ?F (36.6 ?C)  ?TempSrc: Oral Oral Oral Oral  ?SpO2: 100%  100%   ?Weight:      ?Height:      ? ?General: alert, cooperative, and no distress ?Lochia: appropriate ?Uterine Fundus: firm ?Incision: N/A ?DVT Evaluation: No evidence of DVT seen on  physical exam. ?Labs: ?Lab Results  ?Component Value Date  ? WBC 8.1 09/01/2021  ? HGB 9.2 (L) 09/01/2021  ? HCT 26.9 (L) 09/01/2021  ? MCV 91.2 09/01/2021  ? PLT 247 09/01/2021  ? ? ?  Latest Ref Rng & Units 03/14/2021  ?  2:05 PM  ?CMP  ?Glucose 70 - 99 mg/dL 89    ?BUN 6 - 20 mg/dL 6    ?Creatinine 0.44 - 1.00 mg/dL 0.57    ?Sodium 135 - 145 mmol/L 130    ?Potassium 3.5 - 5.1 mmol/L 3.3    ?Chloride 98 - 111 mmol/L 100    ?CO2 22 - 32 mmol/L 23    ?Calcium 8.9 - 10.3 mg/dL 8.9    ? ?Edinburgh Score: ? ?  09/01/2021  ?  1:42 PM  ?Edinburgh Postnatal Depression Scale Screening Tool  ?I have been able to laugh and see the funny side of things. 0  ?I have looked forward with enjoyment to things. 0  ?I have blamed myself unnecessarily when things went wrong. 1  ?I have been anxious or worried for no good reason. 1  ?I have felt scared or panicky for no good reason. 0  ?Things have been getting on top of me. 1  ?I have been so unhappy that I have had difficulty sleeping. 0  ?I have felt sad  or miserable. 0  ?I have been so unhappy that I have been crying. 0  ?The thought of harming myself has occurred to me. 0  ?Edinburgh Postnatal Depression Scale Total 3  ? ? ? ? ?After visit meds:  ?Allergies as of 09/02/2021   ?No Known Allergies ?  ? ?  ?Medication List  ?  ? ?TAKE these medications   ? ?acetaminophen 325 MG tablet ?Commonly known as: TYLENOL ?Take 325 mg by mouth every 6 (six) hours as needed for mild pain. ?  ?butalbital-acetaminophen-caffeine 50-325-40 MG tablet ?Commonly known as: FIORICET ?Take by mouth 2 (two) times daily as needed for headache. ?  ?ibuprofen 600 MG tablet ?Commonly known as: ADVIL ?Take 1 tablet (600 mg total) by mouth every 6 (six) hours as needed. ?  ?loratadine 10 MG tablet ?Commonly known as: CLARITIN ?Take 10 mg by mouth daily as needed for allergies. ?  ?prenatal multivitamin Tabs tablet ?Take 1 tablet by mouth daily at 12 noon. ?  ? ?  ? ? ? ?Discharge home in stable condition ?Infant  Feeding: Breast ?Infant Disposition:home with mother ?Discharge instruction: per After Visit Summary and Postpartum booklet. ?Activity: Advance as tolerated. Pelvic rest for 6 weeks.  ?Diet: routine diet ?Anticipated Birth Control: Unsure ?Postpartum Appointment:6 weeks ?Additional Postpartum F/U:  None ?Future Appointments:No future appointments. ?Follow up Visit: ? Follow-up Information   ? ? Drema Dallas, DO. Schedule an appointment as soon as possible for a visit in 6 week(s).   ?Specialty: Obstetrics and Gynecology ?Why: please call the office to schedule a postpartum visit with Dr. Delora Fuel in 6 weeks ?Contact information: ?[address] ?[address] ?[place] [address] ?[phone] ? ? ?  ?  ? ?  ?  ? ?  ? ? ? ?  ? ?09/02/2021 ?Christophe Louis, MD ? ? ?

## 2021-09-08 ENCOUNTER — Telehealth (HOSPITAL_COMMUNITY): Payer: Self-pay

## 2021-09-08 NOTE — Telephone Encounter (Signed)
No answer. Left message to return nurse call. ? Heron Nay ?05/13//2023,1706 ?

## 2023-04-20 IMAGING — CT CT ANGIO CHEST
2 of 6 series · 18 of 46 positions shown · IV contrast (omnipaque)
Comparison: None.

CLINICAL DATA: Pregnant. Pulmonary embolism, positive D-dimer,
chest pain, dyspnea.

EXAM:
CT ANGIOGRAPHY CHEST WITH CONTRAST
TECHNIQUE: Multidetector CT imaging of the chest was performed using the
standard protocol during bolus administration of intravenous
contrast. Multiplanar CT image reconstructions and MIPs were
obtained to evaluate the vascular anatomy.
CONTRAST:  65mL OMNIPAQUE IOHEXOL 350 MG/ML SOLN

[Series 7: thins · axial · 0.70mm/px · z∈[+899,+1117]mm · 15 of 240 slices shown]
[im 11/240  lung]
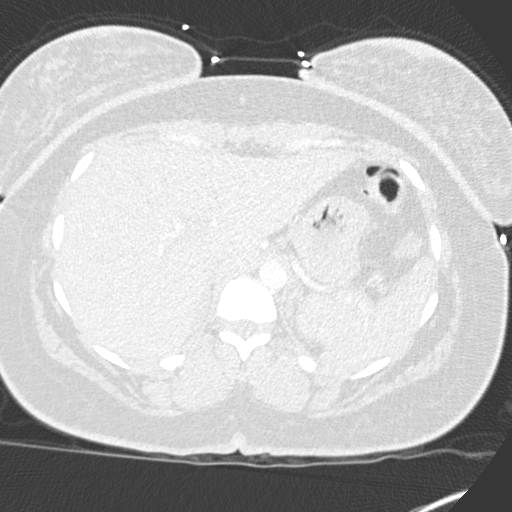
[im 32/240  soft-tissue]
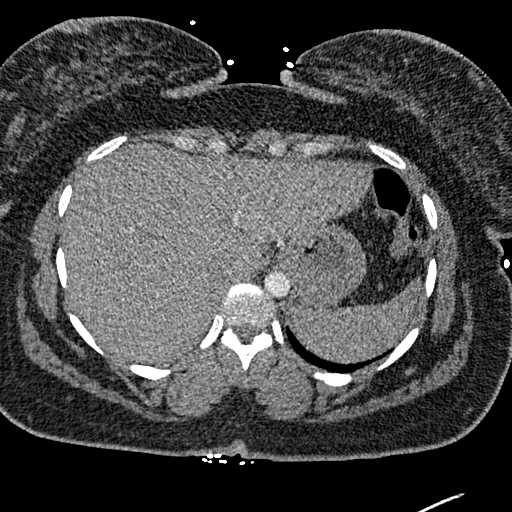
[im 42/240  lung]
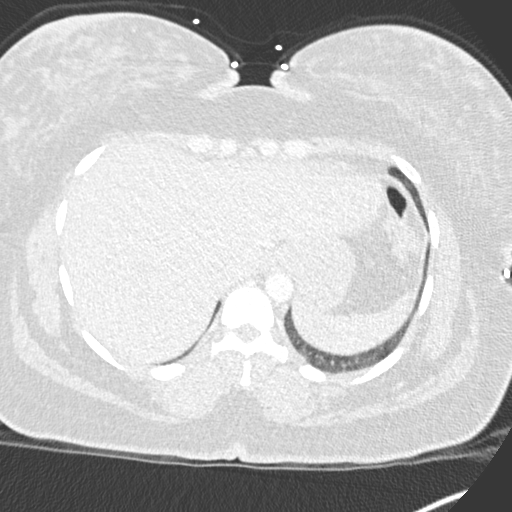
[im 63/240  soft-tissue]
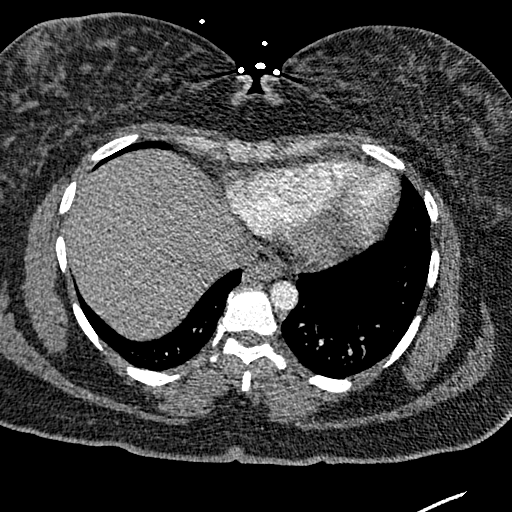
[im 73/240  lung]
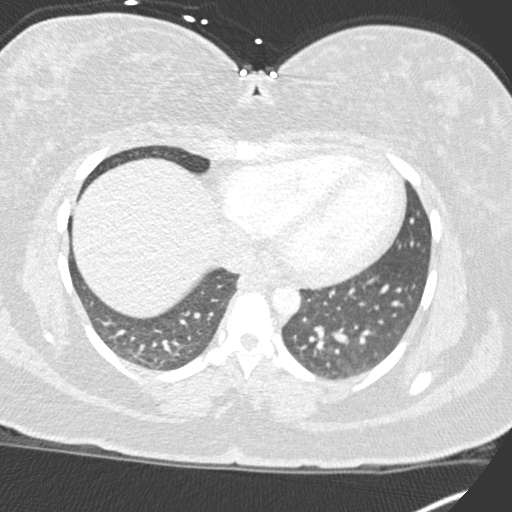
[im 94/240  soft-tissue]
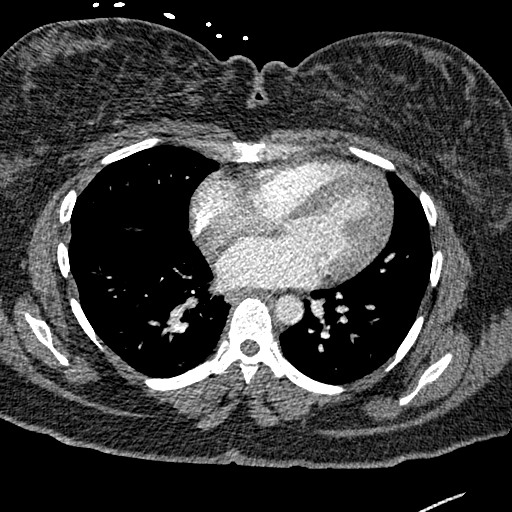
[im 104/240  lung]
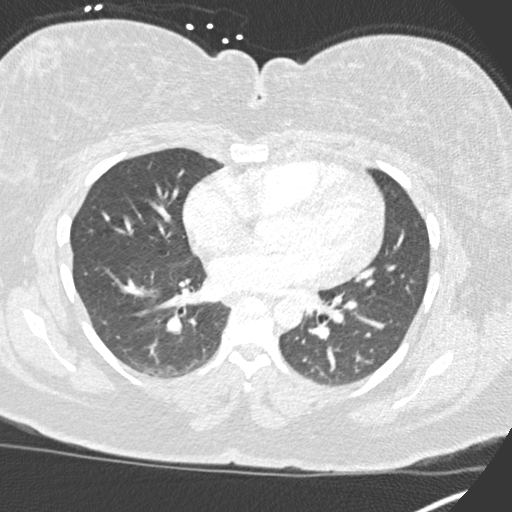
[im 125/240  soft-tissue]
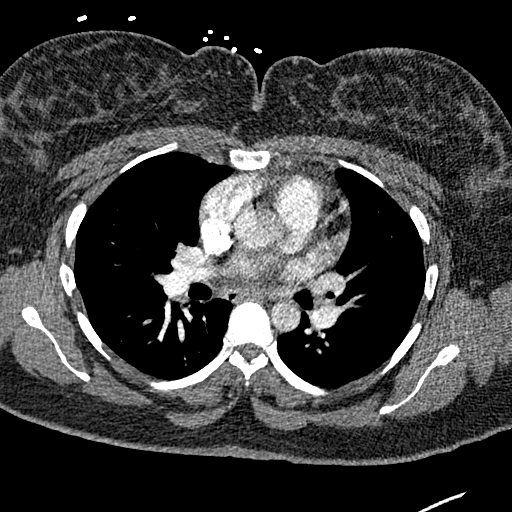
[im 136/240  lung]
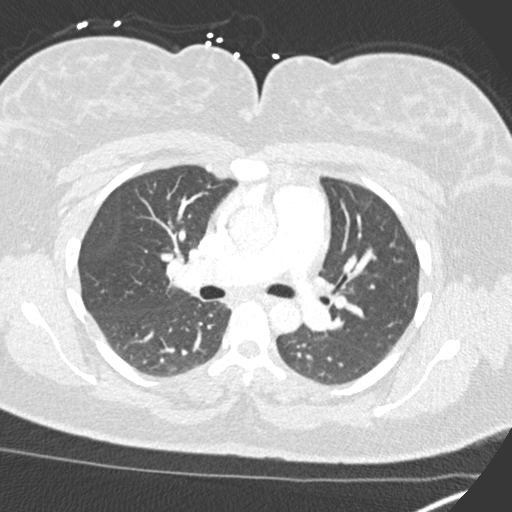
[im 146/240  soft-tissue]
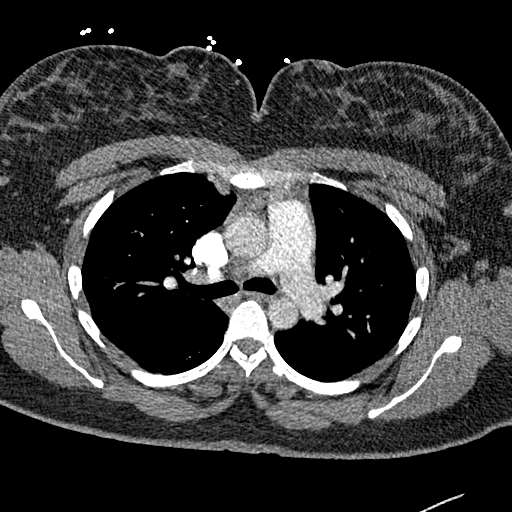
[im 167/240  lung]
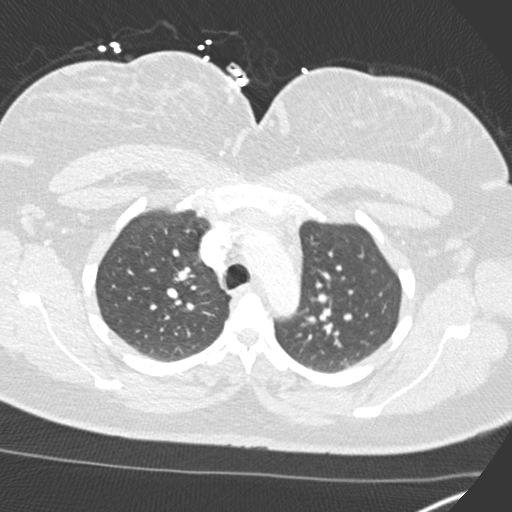
[im 177/240  soft-tissue]
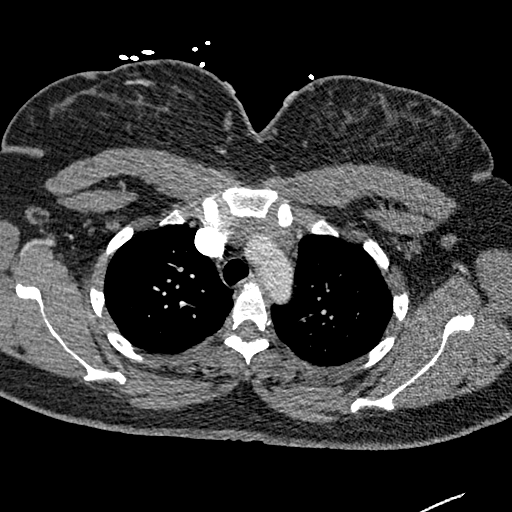
[im 198/240  lung]
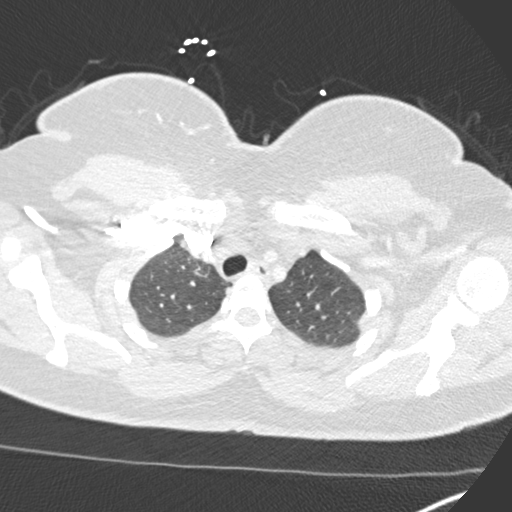
[im 208/240  soft-tissue]
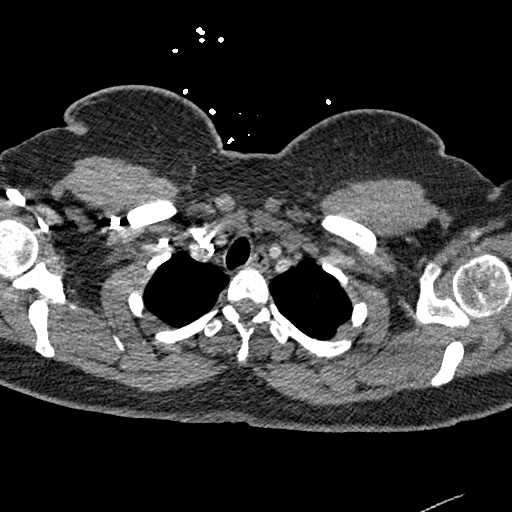
[im 229/240  lung]
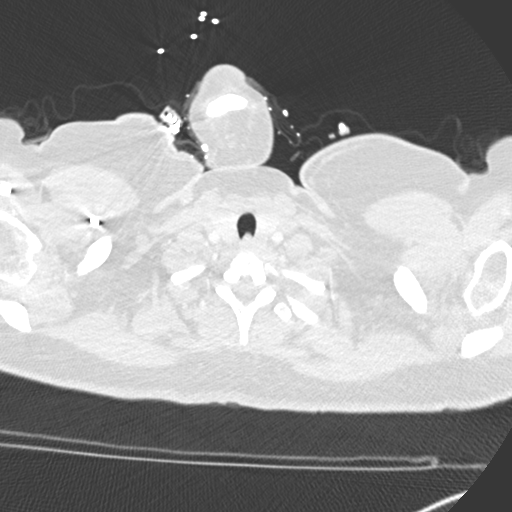

[Series 8: coronal mpr · coronal · 0.49mm/px · 3 of 138 slices shown]
[im 35/138  soft-tissue]
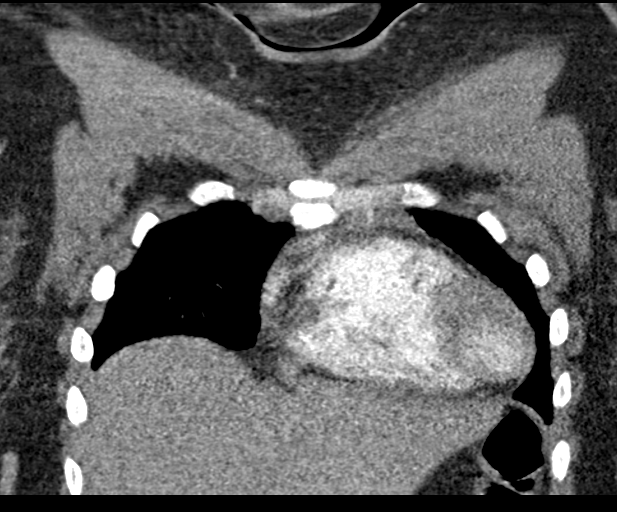
[im 69/138  soft-tissue]
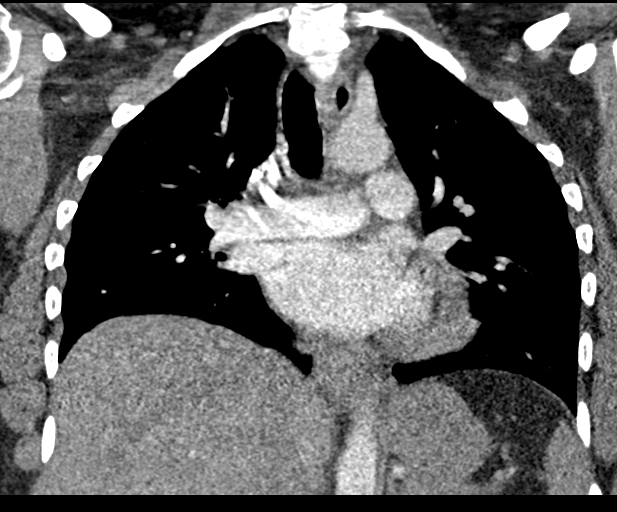
[im 103/138  soft-tissue]
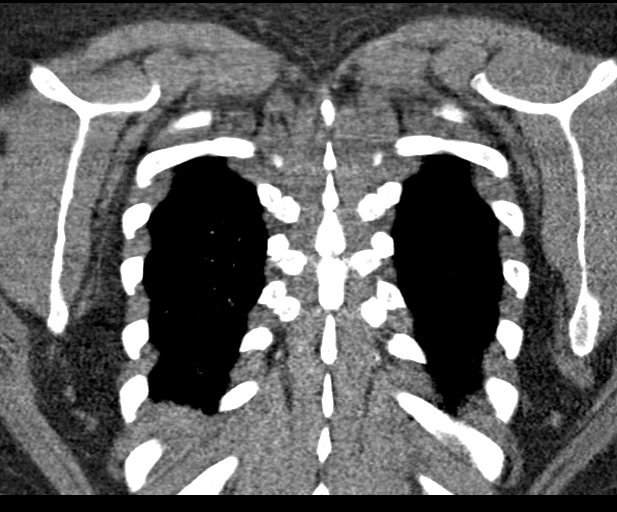

[18 of 46 positions shown; findings below may reference images not displayed]

FINDINGS: Cardiovascular: Pulmonary arterial opacification is slightly
suboptimal, however, the examination is adequate for exclusion of
intraluminal filling defect through the proximal segmental arteries
bilaterally. The distal segmental and subsegmental arteries are not
well opacified on this examination. No intraluminal filling defect
identified within the appropriately opacified segments. The central
pulmonary arteries are of normal caliber. Cardiac size within normal
limits. No pericardial effusion. The thoracic aorta is unremarkable.

Mediastinum/Nodes: No enlarged mediastinal, hilar, or axillary lymph
nodes. Thyroid gland, trachea, and esophagus demonstrate no
significant findings.

Lungs/Pleura: Lungs are clear. No pleural effusion or pneumothorax.

Upper Abdomen: No acute abnormality.

Musculoskeletal: No chest wall abnormality. No acute or significant
osseous findings.

Review of the MIP images confirms the above findings.
IMPRESSION: Slightly limited examination without evidence of intraluminal
filling defect to suggest acute pulmonary embolism through the
proximal segmental pulmonary arterial vasculature. No acute
intrathoracic pathology identified.

## 2023-09-01 IMAGING — US US MFM OB FOLLOW-UP
1 series · 14 of 25 positions shown · non-contrast
Comparison: none

[Series 1: us mfm ob follow-up · 25 acquisitions, 14 frames shown]
[im 1/25]
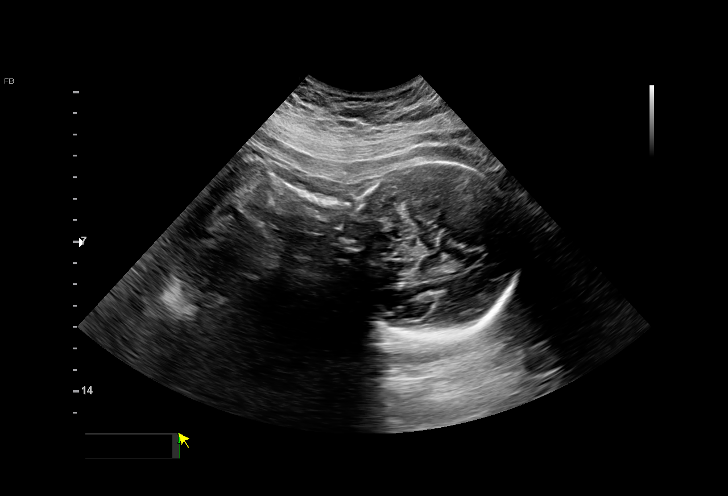
[im 3/25]
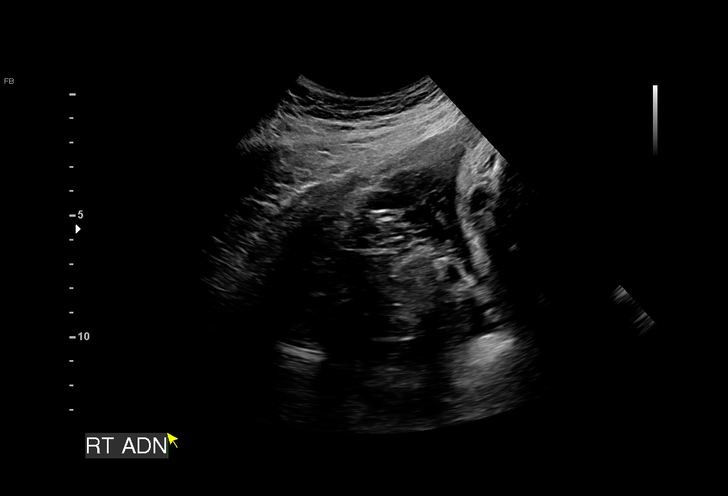
[im 5/25]
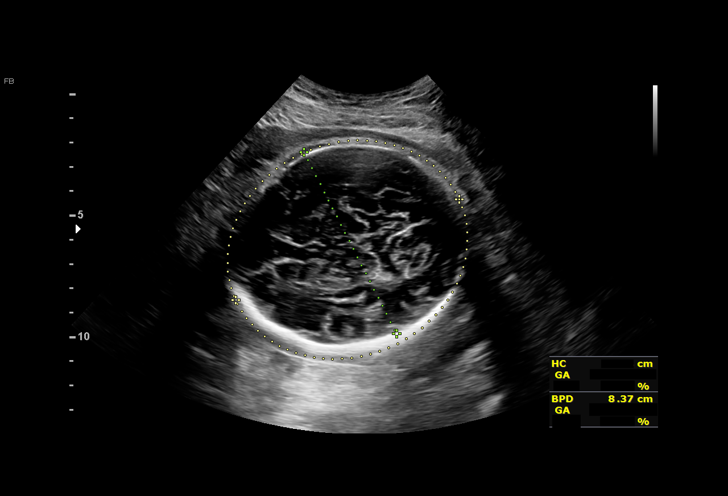
[im 7/25]
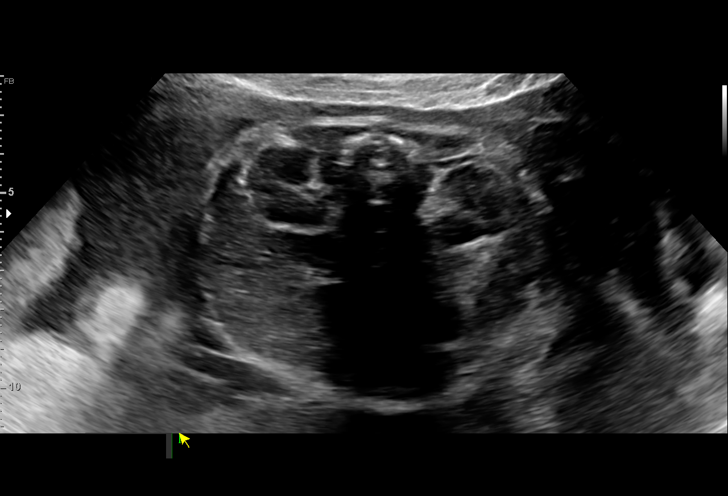
[im 9/25]
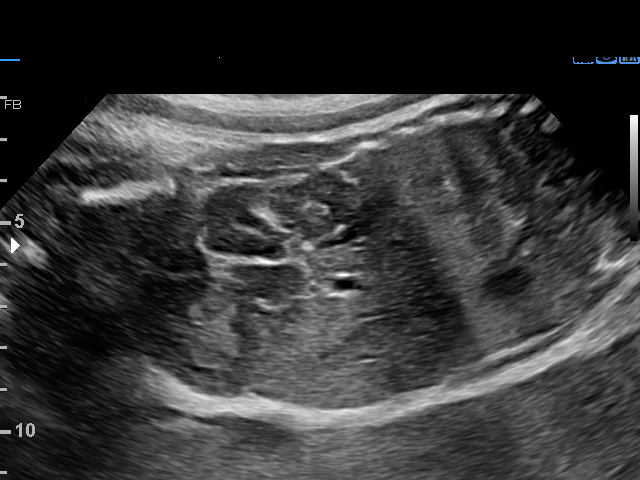
[im 10/25]
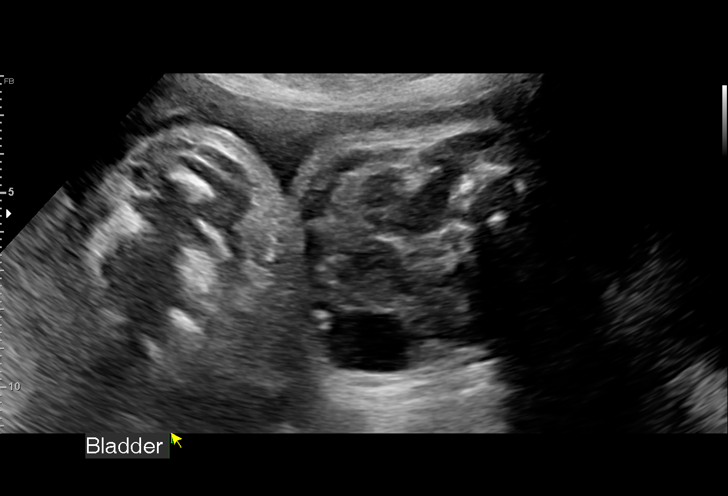
[im 12/25]
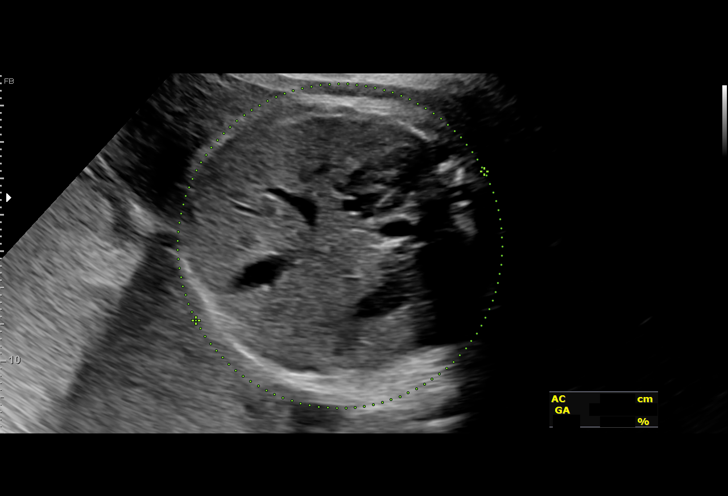
[im 14/25]
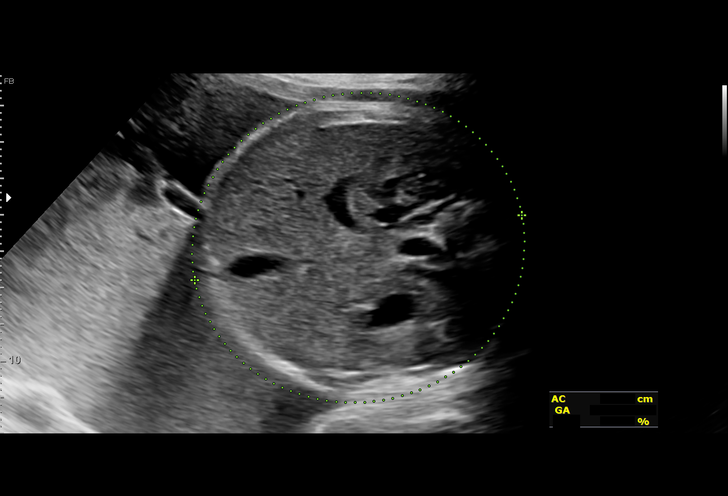
[im 16/25]
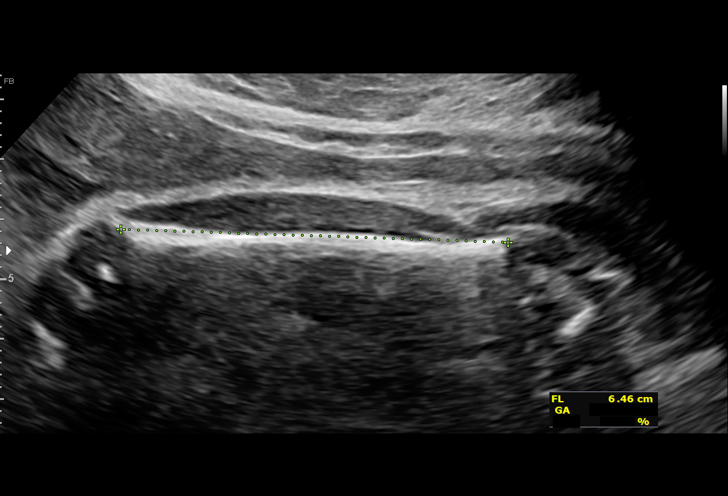
[im 17/25]
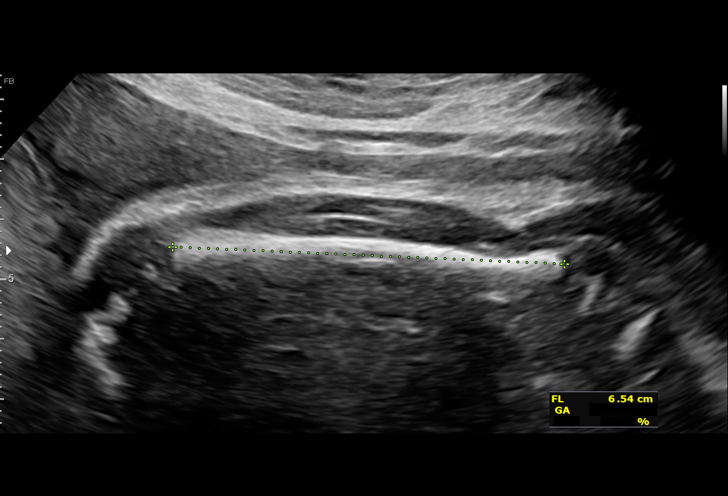
[im 19/25]
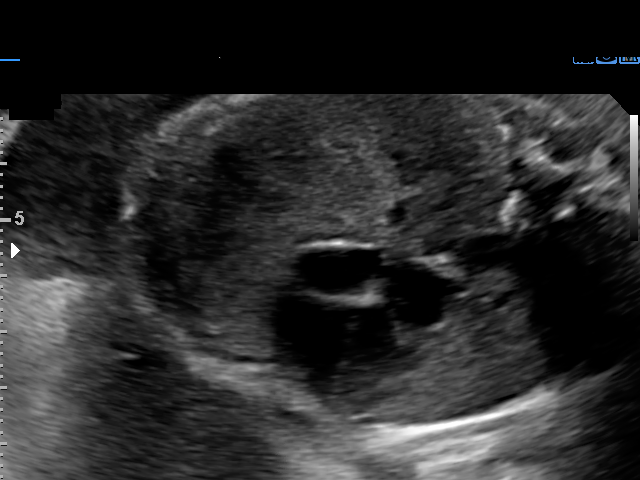
[im 21/25]
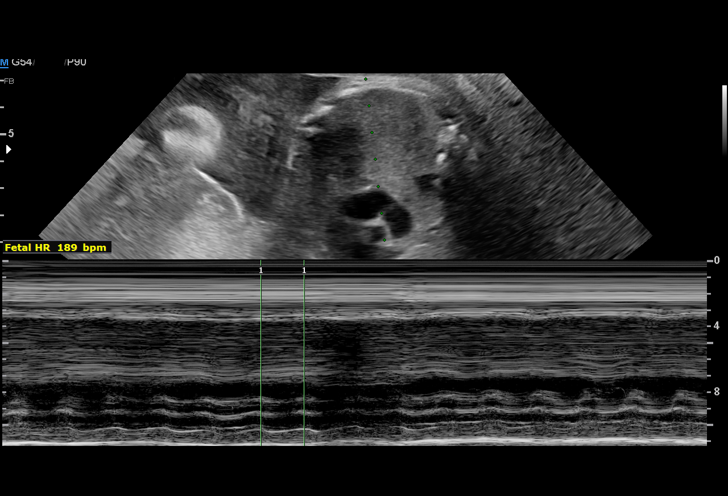
[im 23/25]
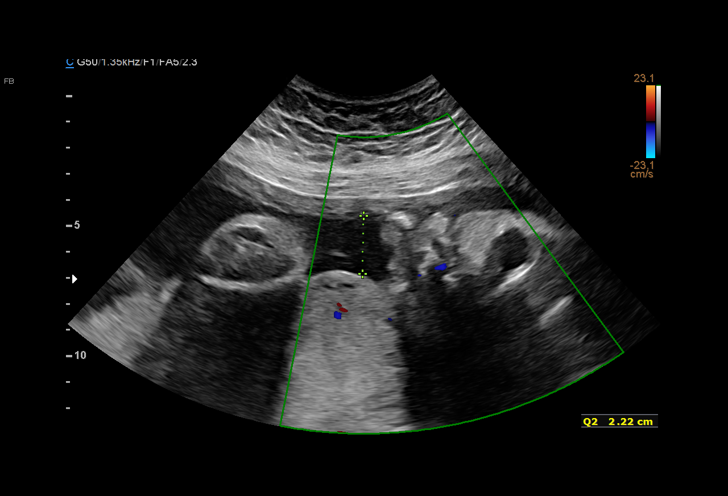
[im 25/25]
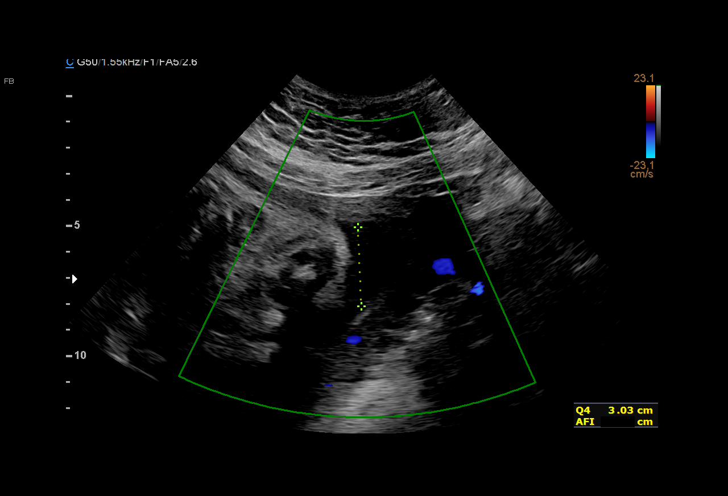

[14 of 25 positions shown; findings below may reference images not displayed]

[REDACTED]
                                                            Ave[address] [HOSPITAL]

                                                      [facility]

Indications

 High risk pregnancy (Increased Finifenmaa
 and anti-SSB antibodies)
 Cervical insufficiency, 2nd
 32 weeks gestation of pregnancy
Fetal Evaluation

 Num Of Fetuses:         1
 Fetal Heart Rate(bpm):  150
 Cardiac Activity:       Observed
 Presentation:           Cephalic
 Placenta:               Posterior
 P. Cord Insertion:      Previously Visualized

 Amniotic Fluid
 AFI FV:      Within normal limits

 AFI Sum(cm)     %Tile       Largest Pocket(cm)
 12.8            38

 RUQ(cm)       RLQ(cm)       LUQ(cm)        LLQ(cm)
 3             3
Biometry
 BPD:      83.7  mm     G. Age:  33w 5d         75  %    CI:        80.28   %    70 - 86
                                                         FL/HC:      22.0   %    19.9 -
 HC:      295.1  mm     G. Age:  32w 4d         15  %    HC/AC:      1.06        0.96 -
 AC:      279.4  mm     G. Age:  32w 0d         32  %    FL/BPD:     77.4   %    71 - 87
 FL:       64.8  mm     G. Age:  33w 3d         62  %    FL/AC:      23.2   %    20 - 24

 Est. FW:    9708  gm      4 lb 7 oz     42  %
OB History

 Gravidity:    2         Term:   1
 Living:       1
Gestational Age

 LMP:           32w 4d        Date:  12/10/20                  EDD:   09/16/21
 U/S Today:     33w 0d                                        EDD:   09/13/21
 Best:          32w 4d     Det. By:  LMP  (12/10/20)          EDD:   09/16/21
Anatomy

 Stomach:               Appears normal, left   Bladder:                Appears normal
                        sided
 Kidneys:               Appear normal
Cervix Uterus Adnexa

 Adnexa
 No abnormality visualized.
Impression

 Increased Finifenmaa and anti-SSB antibodies.
 Cervical insufficiency diagnostic midtrimester ultrasound.
 Patient is not taking vaginal progesterone treatment.

 Fetal growth is appropriate for gestational age.  Amniotic fluid
 is normal and good fetal activity seen.  Fetal heart rate and
 rhythm appear normal.  We could not assess the cervix on
 transabdominal ultrasound.  Patient does not have symptoms
 of preterm labor.

Recommendations

 -An appointment was made for her to return in 4 weeks for
 fetal growth assessment.
                Jiimenez, Ivan Alberto
